# Patient Record
Sex: Male | Born: 1940 | Race: White | Hispanic: No | Smoking: Never smoker
Health system: Southern US, Community
[De-identification: ages and names within clinical notes are randomized; demographics above are authoritative.]

## PROBLEM LIST (undated history)

## (undated) DIAGNOSIS — I10 Essential (primary) hypertension: Secondary | ICD-10-CM

---

## 2006-04-17 ENCOUNTER — Emergency Department: Payer: Self-pay | Admitting: Emergency Medicine

## 2006-04-17 ENCOUNTER — Other Ambulatory Visit: Payer: Self-pay

## 2014-04-06 ENCOUNTER — Emergency Department: Payer: Self-pay | Admitting: Emergency Medicine

## 2015-02-10 ENCOUNTER — Inpatient Hospital Stay: Payer: Self-pay | Admitting: Internal Medicine

## 2015-02-19 ENCOUNTER — Ambulatory Visit: Payer: Self-pay

## 2015-04-15 NOTE — H&P (Signed)
PATIENT NAME:  Luke HolmesMAY, Luke J MR#:  [id number] DATE OF BIRTH:  1941-08-09  DATE OF ADMISSION:  02/10/2015  REFERRING PHYSICIAN: Eartha Inchory R. York CeriseForbach, MD   PRIMARY CARE PHYSICIAN: Nonlocal.   ADMISSION DIAGNOSES: Urinary tract infection and sepsis.   HISTORY OF PRESENT ILLNESS: This is a 74 year old Caucasian male who presents to the Emergency Department via EMS after being found confused on the floor of his bathroom at home. The patient reportedly had oxygen saturations in the 70s prior to transportation to the Emergency Department. Here, at the hospital, his oxygen saturations were found to be normal. He was initially very confused, but now is more clear about his surroundings and the circumstances are preceding his evaluation in the Emergency Department. He remembers feeling ill for 3-4 days. He has had multiple episodes of nausea and vomiting, all of which have been nonbloody and nonbilious. The patient admits to some dysuria lately, but denies chest pain or shortness of breath. The patient admits to fevers. In the Emergency Department, he was found to have a grossly infected urine as well as leukocytosis and fever, which prompted the Emergency Department to call for admission.   REVIEW OF SYSTEMS:  CONSTITUTIONAL: The patient admits to fever and weakness.  EYES: Denies blurred vision or inflammation.  EARS, NOSE AND THROAT: Denies tinnitus or sore throat.  RESPIRATORY: Denies cough or shortness of breath.  CARDIOVASCULAR: Denies chest pain, palpitations, orthopnea, or paroxysmal nocturnal dyspnea.  GASTROINTESTINAL: Admits to nausea and vomiting, but denies diarrhea or abdominal pain.  GENITOURINARY: Admits to dysuria as well as increased frequency, but denies hesitancy of urination.  ENDOCRINE: Denies polyuria or polydipsia.  HEMATOLOGIC AND LYMPHATIC: Denies easy bruising or bleeding.  INTEGUMENTARY: Denies rashes or lesions.  MUSCULOSKELETAL: Denies arthralgias or myalgias.  NEUROLOGIC: Denies  numbness in his extremities or difficulty speaking.  PSYCHIATRIC: Denies depression or suicidal ideation.   PAST MEDICAL HISTORY: Hypertension and anxiety.   PAST SURGICAL HISTORY: The patient has never undergone surgery.   SOCIAL HISTORY: The patient lives with his sister and her boyfriend. He does not smoke, drink, or do any drugs.   FAMILY HISTORY: The patient's father is deceased of coronary artery disease and his mother is deceased of complications from emphysema.   HOME MEDICATIONS: 1.  Aspirin 81 mg 1 tablet p.o. daily.  2.  Clonidine, dose not specified, 1 tablet p.o. daily.  3.  Hydrochlorothiazide, dose unspecified, 1 tablet p.o. daily.  4.  Klor-Con, dose unspecified.  5.  Vicodin, dose unspecified.   ALLERGIES: SULFA DRUGS.   PERTINENT LABORATORY RESULTS AND RADIOGRAPHIC FINDINGS: Serum glucose is 202, BUN is 15, creatinine 1.04, serum sodium is 132, potassium is 3.6, chloride is 96, bicarbonate 30, calcium is 8.3, magnesium is 1.6, phosphorus 1.3. Lipase is 51, serum albumin is 3.3, alkaline phosphatase 77, AST is 32, ALT 25. Troponin is negative. White blood cell count is 15.3, hemoglobin 15.4, hematocrit 45.7, platelet count is 234,000, MCV is 86. Urinalysis shows 368 white blood cells per high-power field, 9 red blood cells per high-power field, it is nitrite positive as well as 2+ leukocyte esterase positive. Lactic acid is 2.2. Chest x-ray shows an ill-defined right basilar opacity, which Mehler reflect pneumonia.   PHYSICAL EXAMINATION:  VITAL SIGNS: Temperature is 102.1, pulse is 88, respirations are 24, blood pressure 126/69, pulse oximetry is 91% on room air.  GENERAL: The patient is alert and oriented x 3 in no apparent distress.  HEENT: Normocephalic, atraumatic. Pupils equal, round, and reactive to  light and accommodation. Extraocular movements are intact. Mucous membranes are moist.  NECK: Trachea is midline. No adenopathy. Thyroid is nonpalpable and nontender.   CHEST: Symmetric and atraumatic.  CARDIOVASCULAR: Regular rate and rhythm. Normal S1, S2. No rubs, clicks, or murmurs appreciated.  LUNGS: Clear to auscultation bilaterally. Normal effort and excursion.  ABDOMEN: Positive bowel sounds. Soft, nontender, nondistended. No hepatosplenomegaly.  GENITOURINARY: Deferred.  MUSCULOSKELETAL: The patient moves all 4 extremities equally. I have not observed his gait.  SKIN: Warm and dry. No rashes or lesions.  EXTREMITIES: No clubbing, cyanosis, or edema.  NEUROLOGIC: Cranial nerves II-XII are grossly intact.  PSYCHIATRIC: Mood is normal. Affect is congruent. The patient has good judgment and insight into his medical condition.   ASSESSMENT AND PLAN: This is a 74 year old male admitted for urinary tract infection and sepsis.  1.  Urinary tract infection. The patient has been given ceftriaxone in the Emergency Room. We have obtained blood cultures and urine cultures. I will add vancomycin to the patient's regimen, as he meets criteria for sepsis and also because he Waiters have pneumonia as well.  2.  Sepsis. The patient meets criteria via fever and leukocytosis. He is hemodynamically stable at this time.  3.  Hypertension. We will continue hydrochlorothiazide. The patient also has clonidine on his home medication list, but the doses of these medicines or not available. I believe his clonidine [place] be for anxiety, but if he appears to have rebound hypertension or blood pressure that is way more than expected, I will restart this medicine as it must be scheduled.  4.  Hyponatremia. This is probably likely secondary to hypovolemia. We will volume resuscitate him at with sodium in normal saline that should improve his serum sodium concentration.  5.  Deep vein thrombosis prophylaxis. Heparin.  6.  Gastrointestinal prophylaxis. None, as the patient is not critically ill.   CODE STATUS: The patient is a full code.   TIME SPENT ON ADMISSION ORDERS AND PATIENT CARE:  Approximately 45 minutes.     ____________________________ Kelton Pillar. Sheryle Hail, MD msd:bm D: 02/10/2015 07:43:11 ET T: 02/10/2015 07:58:19 ET JOB#: [id number]  cc: Kelton Pillar. Sheryle Hail, MD, <Dictator> Kelton Pillar Toshia Larkin MD ELECTRONICALLY SIGNED 02/10/2015 19:40

## 2015-04-15 NOTE — Discharge Summary (Signed)
PATIENT NAME:  Janeal HolmesMAY, Rubel J MR#:  [id number] DATE OF BIRTH:  1941-08-02  DATE OF ADMISSION:  02/10/2015 DATE OF DISCHARGE:  02/12/2015  PRESENTING COMPLAINT: Altered mental status.   DISCHARGE DIAGNOSES:  1.  Sepsis due to urinary tract infection. 2.  Urinary tract infection with pansensitive Escherichia coli.  3.  Altered mental status due to sepsis. 4.  Possible pneumonia.  5.  Hypertension.  6.  Hyponatremia.  7.  Hypokalemia.   CONSULTATIONS: None.   PROCEDURES:  1.  CT scan of the head without contrast shows atrophy and small vessel disease. No evidence for acute intracranial abnormality. Chronic sinusitis.  2.  Chest x-ray shows ill-defined right basilar opacity that Jeanmarie reflect pneumonia in the appropriate clinical setting. Dedicated PA and lateral views Arganbright be helpful if the patient is able.   HISTORY OF PRESENT ILLNESS: This 74 year old Caucasian man presents to the Emergency Room by EMS past after being found confused on the floor of his bathroom at home. He also had oxygen saturation in the 70s prior to transportation by EMS. On presentation his saturation is normal. He is confused. He reports feeling ill for 3-4 days with multiple episodes of nausea and vomiting. He admits to recent dysuria but no shortness of breath or chest pain. He was found to be febrile with leukocytosis and was admitted for sepsis.  1.  Sepsis due to urinary tract infection: Urinalysis frankly positive for infection. Chest x-ray very questionable for pneumonia. No respiratory distress or other respiratory symptoms during admission. Blood cultures were negative. He was treated with Rocephin and vancomycin initially and this was changed to Rocephin only and then to Levaquin on discharge.  2.  Urinary tract infection: Culture shows pansensitive Escherichia coli. The patient has been treated with Rocephin and will continue course of Levaquin for complicated urinary tract infection for 7 days. He notes that he has  had several urinary tract infections in the past. He has never seen a urologist and was referred to a urologist upon discharge for evaluation due to recurrent urinary tract infections.   3.  Hyponatremia. Presenting serum sodium was 132. This improved with IV fluid.  4.  Hypokalemia. The patient became hypokalemic after discharge with a potassium of 3.0. This was repleted. His hydrochlorothiazide was also stopped as this can decrease serum potassium. This should be followed in the outpatient setting.   5.  Hypomagnesium and hypophosphatemia: These are also likely due to gastrointestinal losses with vomiting. These were repleted in the hospital and should be rechecked in the outpatient setting. 6.  Metabolic encephalopathy: Altered mental status on presentation resolved quickly with treatment of the urinary tract infection. At the time of discharge he is intact. Family is present and they report that he is at his baseline. He has also been evaluated by physical therapy. He reports that he has no further home health needs.  7.  Fall: While he was encephalopathic he did have a fall while getting up out of a bad and he did have a head impact. CT scan of the head was negative for any signs of bleeding. He did not have any visible trauma as a result of the fall. Physical therapy has evaluated him and no further home health physical therapy is needed.   DISCHARGE PHYSICAL EXAMINATION:  VITAL SIGNS: Temperature 97.5, pulse 77, respirations 18, blood pressure 168/88, oxygenation 95% on room air.  GENERAL: No acute distress.  RESPIRATORY: Lungs clear to auscultation bilaterally with good air movement.  CARDIOVASCULAR:  Regular rate and rhythm. No murmurs, rubs, or gallops.  ABDOMEN: Soft, nontender, nondistended. No guarding, no rebound. Bowel sounds are normal.  PSYCHIATRIC: The patient is alert and oriented with good insight into his clinical conditions.   LABORATORY DATA: Sodium 141, potassium 3.2 (This has  been repleted with 80 milliequivalents of potassium over the course of 8 hours today). Chloride 104, bicarbonate 30, BUN 6, creatinine 0.68, glucose 126, magnesium 1.9. LFTs normal. White blood cells 7.9, hemoglobin 13.1, platelets 236,000, MCV 85. Urine is positive for greater than 100,000 colonies of pansensitive Escherichia coli. Blood cultures negative x2.   DISCHARGE MEDICATIONS:  1.  Alprazolam 1 mg twice a day as needed for anxiety.  2.  Enalapril 20 mg 1 tablet twice a day.  3.  Amlodipine 5 mg 1 tablet once a day.  4.  Vicodin extended release 300 mg/7.5 mg 1 tablet every 8 hours as needed for pain.  5.  Aspirin 81 mg 1 tablet once a day.  6.  Levofloxacin 750 mg 1 tablet once a day for 5 days to complete a 7 day course.   DISPOSITION: Discharged to home with no further home health needs.   CONDITION ON DISCHARGE: Stable.   DISCHARGE INSTRUCTIONS:  DIET: Heart healthy diet.  ACTIVITY RESTRICTIONS: None.  TIME FRAME FOR FOLLOW-UP:  Please follow up as scheduled with Dr. Vanna Scotland and with Dr. Arlana Pouch.   TIME SPENT ON DISCHARGE: 40 minutes.   ____________________________ Ena Dawley. Clent Ridges, MD cpw:mc D: 02/12/2015 21:06:59 ET T: 02/13/2015 09:02:26 ET JOB#: [id number]  cc: Ena Dawley. Clent Ridges, MD, <Dictator> Claris Gladden, MD Jillene Bucks. Arlana Pouch, MD  Gale Journey MD ELECTRONICALLY SIGNED 02/24/2015 10:50

## 2016-02-21 ENCOUNTER — Telehealth: Payer: Self-pay

## 2016-02-21 NOTE — Telephone Encounter (Signed)
Spoke with pt daughter in law and made aware pt needs an appt prior to a refill on medications. Daughter in law stated that pt is having urinary symptoms and needs to be seen. Daughter in law stated she would have pt return the call when he returns home.

## 2016-03-24 ENCOUNTER — Other Ambulatory Visit: Payer: Self-pay | Admitting: Family Medicine

## 2016-03-24 DIAGNOSIS — N5089 Other specified disorders of the male genital organs: Secondary | ICD-10-CM

## 2016-03-25 ENCOUNTER — Ambulatory Visit: Payer: Medicare HMO

## 2016-03-28 ENCOUNTER — Ambulatory Visit
Admission: RE | Admit: 2016-03-28 | Discharge: 2016-03-28 | Disposition: A | Discharge status: Home / Self Care | Payer: Medicare HMO | Source: Ambulatory Visit | Attending: Family Medicine | Admitting: Family Medicine

## 2016-03-28 DIAGNOSIS — N451 Epididymitis: Secondary | ICD-10-CM | POA: Diagnosis not present

## 2016-03-28 DIAGNOSIS — N5089 Other specified disorders of the male genital organs: Secondary | ICD-10-CM

## 2016-03-28 DIAGNOSIS — N509 Disorder of male genital organs, unspecified: Secondary | ICD-10-CM | POA: Diagnosis present

## 2016-03-28 DIAGNOSIS — N433 Hydrocele, unspecified: Secondary | ICD-10-CM | POA: Insufficient documentation

## 2019-11-09 ENCOUNTER — Emergency Department
Admission: EM | Admit: 2019-11-09 | Discharge: 2019-11-10 | Disposition: A | Discharge status: Home / Self Care | Payer: Medicare HMO | Attending: Student | Admitting: Student

## 2019-11-09 ENCOUNTER — Other Ambulatory Visit: Payer: Self-pay

## 2019-11-09 ENCOUNTER — Encounter: Payer: Self-pay | Admitting: Emergency Medicine

## 2019-11-09 ENCOUNTER — Emergency Department: Payer: Medicare HMO

## 2019-11-09 DIAGNOSIS — R2243 Localized swelling, mass and lump, lower limb, bilateral: Secondary | ICD-10-CM | POA: Insufficient documentation

## 2019-11-09 DIAGNOSIS — R42 Dizziness and giddiness: Secondary | ICD-10-CM | POA: Diagnosis not present

## 2019-11-09 DIAGNOSIS — M7989 Other specified soft tissue disorders: Secondary | ICD-10-CM

## 2019-11-09 DIAGNOSIS — I10 Essential (primary) hypertension: Secondary | ICD-10-CM | POA: Insufficient documentation

## 2019-11-09 DIAGNOSIS — L03116 Cellulitis of left lower limb: Secondary | ICD-10-CM | POA: Insufficient documentation

## 2019-11-09 DIAGNOSIS — L539 Erythematous condition, unspecified: Secondary | ICD-10-CM | POA: Diagnosis not present

## 2019-11-09 HISTORY — DX: Essential (primary) hypertension: I10

## 2019-11-09 LAB — CBC
HCT: 42.8 % (ref 39.0–52.0)
Hemoglobin: 14.7 g/dL (ref 13.0–17.0)
MCH: 29.8 pg (ref 26.0–34.0)
MCHC: 34.3 g/dL (ref 30.0–36.0)
MCV: 86.6 fL (ref 80.0–100.0)
Platelets: 311 10*3/uL (ref 150–400)
RBC: 4.94 MIL/uL (ref 4.22–5.81)
RDW: 11.9 % (ref 11.5–15.5)
WBC: 15.2 10*3/uL — ABNORMAL HIGH (ref 4.0–10.5)
nRBC: 0 % (ref 0.0–0.2)

## 2019-11-09 LAB — BASIC METABOLIC PANEL
Anion gap: 10 (ref 5–15)
BUN: 15 mg/dL (ref 8–23)
CO2: 25 mmol/L (ref 22–32)
Calcium: 9.5 mg/dL (ref 8.9–10.3)
Chloride: 99 mmol/L (ref 98–111)
Creatinine, Ser: 0.62 mg/dL (ref 0.61–1.24)
GFR calc Af Amer: >60 mL/min (ref >60)
GFR calc non Af Amer: >60 mL/min (ref >60)
Glucose, Bld: 141 mg/dL — ABNORMAL HIGH (ref 70–99)
Potassium: 4 mmol/L (ref 3.5–5.1)
Sodium: 134 mmol/L — ABNORMAL LOW (ref 135–145)

## 2019-11-09 LAB — TROPONIN I (HIGH SENSITIVITY): Troponin I (High Sensitivity): 5 ng/L (ref <18)

## 2019-11-09 MED ORDER — SODIUM CHLORIDE 0.9% FLUSH: 3.0000 mL | Freq: Once | INTRAVENOUS | Status: DC

## 2019-11-09 NOTE — ED Triage Notes (Signed)
PT c/o dizziness, states hx of vertigo but worsening x1wk with a fall last week. Denies any numbness or weakness. Pt appears to have slurred speech, pt does not have dentures in place. PT states over the past few weeks he has been told his speech sounds off by family. Denies taking any anticoags

## 2019-11-09 NOTE — ED Triage Notes (Signed)
ED via EMS for intermittent dizziness with hx of vertigo dx 1 week ago. Pt had a fall 4 days ago due to increased bilateral leg swelling. Pt reports the swelling is worsening and he feels as though he is having difficulty ambulating independently.   167 CBG 18G Rhand

## 2019-11-09 NOTE — ED Provider Notes (Signed)
[facility] Emergency Department Provider Note  ____________________________________________   None    (approximate)  I have reviewed the triage vital signs and the nursing notes.  History  Chief Complaint Dizziness    HPI Luke Haynes is a 78 y.o. male with history of HTN who presents to the emergency department for bilateral leg swelling.  Patient states he has noticed progressively increasing bilateral lower extremity swelling over the last several days, left slightly greater than right.  He is unsure if the swelling is related, but states he first noticed it after a mild mechanical fall several days ago.  He states he fell because he was "clumsy" and denies any preceding presyncopal symptoms, specifically denies dizziness, lightheadedness, LOC, chest pain, visual changes, or vertigo.  He states he fell softly to his side, and "popped right back up" with no related injuries.  He states one of his family members was concerned he might have a blood clot as the etiology of his leg swelling, and advised him to seek care.  He denies any history of VTE, no recent surgeries or prolonged immobilization, no recent travel.  He denies any cough, shortness of breath, difficulty breathing, or chest pain. He does not some mild redness to the left anterior shin. Denies any trauma to this area.  Per triage note, patient had presented with EMS for intermittent dizziness in the setting of a history of vertigo.  However, on my interview the patient specifically denies any recent dizziness, lightheadedness, or recent episodes of vertigo.  He specifically denies any vertigo as etiology for his recent fall.  He denies any changes to his speech or slurred speech.  He denies any extremity weakness, numbness, tingling. He thinks maybe his family miscommunication or was misunderstood, and were trying to simply tell EMS that he has a history of vertigo, with regards to his general medical  historyo.   Past Medical Hx Past Medical History:  Diagnosis Date  . Hypertension     Problem List There are no active problems to display for this patient.   Past Surgical Hx History reviewed. No pertinent surgical history.  Medications Prior to Admission medications   Not on File    Allergies Patient has no known allergies.  Family Hx No family history on file.  Social Hx Social History   Tobacco Use  . Smoking status: Never Smoker  . Smokeless tobacco: Never Used  Substance Use Topics  . Alcohol use: Not Currently  . Drug use: Never     Review of Systems  Constitutional: Negative for fever, chills. Eyes: Negative for visual changes. ENT: Negative for sore throat. Cardiovascular: Negative for chest pain. Respiratory: Negative for shortness of breath. Gastrointestinal: Negative for nausea, vomiting.  Genitourinary: Negative for dysuria. Musculoskeletal: + for leg swelling. Skin: Negative for rash. Neurological: Negative for for headaches.   Physical Exam  Vital Signs: ED Triage Vitals  Enc Vitals Group     BP 11/09/19 1841 (!) 141/88     Pulse Rate 11/09/19 1841 78     Resp 11/09/19 1841 18     Temp 11/09/19 1841 97.7 F (36.5 C)     Temp Source 11/09/19 1841 Oral     SpO2 11/09/19 1841 98 %     Weight 11/09/19 1842 200 lb (90.7 kg)     Height 11/09/19 1842 5\' 9"  (1.753 m)     Head Circumference --      Peak Flow --  Pain Score 11/09/19 1853 0     Pain Loc --      Pain Edu? --      Excl. in GC? --     Constitutional: Alert and oriented.  Head: Normocephalic. Atraumatic. Eyes: Conjunctivae clear. Sclera anicteric. Nose: No congestion. No rhinorrhea. Mouth/Throat: Wearing mask.  Neck: No stridor.   Cardiovascular: Normal rate, regular rhythm. Extremities well perfused. Respiratory: Normal respiratory effort.  Lungs CTAB. Gastrointestinal: Soft. Non-tender. Non-distended.  Musculoskeletal: Bilateral pitting edema to the LE to level  of the knee. LEFT very slightly larger than R. Left anterior shin with mild associated erythema and warmth.  Neurologic:  No gross focal neurologic deficits are appreciated. Alert and oriented.  Face symmetric.  Tongue midline.  Cranial nerves II through XII intact. UE and LE strength 5/5 and symmetric. UE and LE SILT. No dysarthria or aphasia.  Skin: Mild erythema and warmth to the left anterior shin. Psychiatric: Mood and affect are appropriate for situation.  EKG  Personally reviewed.   Rate: 77 Rhythm: sinus Axis: normal Intervals: WNL PVCs No STEMI    Radiology  CTH:  IMPRESSION:  No evidence of acute intracranial abnormality.  Atrophy with small vessel ischemic changes.   [place]:  IMPRESSION: No evidence of deep venous thrombosis in either lower extremity.  Right popliteal cyst.   CXR:  IMPRESSION:  1. Mild central venous congestion without frank pulmonary edema.  2. Unchanged mild cardiomegaly.    Procedures  Procedure(s) performed (including critical care):  Procedures   Initial Impression / Assessment and Plan / ED Course  78 y.o. male who presents to the ED for bilateral lower extremity swelling, LEFT slightly greater than RIGHT.  With some mild LEFT anterior shin redness, warmth.  Ddx: cellulitis, DVT, venous insufficiency  Labs revealed mild leukocytosis to 15.  CT head negative. [place] negative for DVT, right popliteal cyst noted. As such, given otherwise negative work-up, will plan for course of antibiotic for treatment of cellulitis, based on exam. Advise PCP follow up, given return precautions.  Patient voices understanding is comfortable plan and discharge.  Given first dose of antibiotics here.    Final Clinical Impression(s) / ED Diagnosis  Final diagnoses:  Leg swelling  Cellulitis of left lower extremity       Note:  This document was prepared using Dragon voice recognition software and Carollo include unintentional dictation errors.   Miguel Aschoff., MD 11/10/19 [phone]

## 2019-11-10 ENCOUNTER — Emergency Department: Payer: Medicare HMO

## 2019-11-10 LAB — URINALYSIS, COMPLETE (UACMP) WITH MICROSCOPIC
Bacteria, UA: NONE SEEN
Bilirubin Urine: NEGATIVE
Glucose, UA: NEGATIVE mg/dL
Hgb urine dipstick: NEGATIVE
Ketones, ur: NEGATIVE mg/dL
Leukocytes,Ua: NEGATIVE
Nitrite: NEGATIVE
Protein, ur: NEGATIVE mg/dL
Specific Gravity, Urine: 1.008 (ref 1.005–1.030)
Squamous Epithelial / LPF: NONE SEEN (ref 0–5)
WBC, UA: NONE SEEN WBC/hpf (ref 0–5)
pH: 7 (ref 5.0–8.0)

## 2019-11-10 LAB — BRAIN NATRIURETIC PEPTIDE: B Natriuretic Peptide: 113 pg/mL — ABNORMAL HIGH (ref 0.0–100.0)

## 2019-11-10 MED ORDER — CEPHALEXIN 500 MG PO CAPS: 500.0000 mg | ORAL_CAPSULE | Freq: Four times a day (QID) | ORAL | 0 refills | Status: AC

## 2019-11-10 MED ORDER — CEPHALEXIN 500 MG PO CAPS
500.0000 mg | ORAL_CAPSULE | Freq: Once | ORAL | Status: AC
  Administered 2019-11-10: 500 mg via ORAL
  Filled 2019-11-10: qty 1

## 2019-11-10 NOTE — Discharge Instructions (Signed)
Thank you for letting us take care of you in the emergency department today. Your ultrasound was negative for blood clots.   Please continue to take any regular, prescribed medications.   New medications we have prescribed:  - Keflex - for skin infection  Please follow up with: - Your primary care doctor to review your ER visit and follow up on your symptoms.   Please return to the ER for any new or worsening symptoms.

## 2019-12-07 ENCOUNTER — Emergency Department: Payer: Medicare HMO

## 2019-12-07 ENCOUNTER — Observation Stay: Payer: Medicare HMO

## 2019-12-07 ENCOUNTER — Other Ambulatory Visit: Payer: Self-pay

## 2019-12-07 ENCOUNTER — Inpatient Hospital Stay
Admission: EM | Admit: 2019-12-07 | Discharge: 2019-12-11 | DRG: 872 | Disposition: A | Discharge status: Skilled Nursing Facility | Payer: Medicare HMO | Attending: Internal Medicine | Admitting: Internal Medicine

## 2019-12-07 DIAGNOSIS — I119 Hypertensive heart disease without heart failure: Secondary | ICD-10-CM | POA: Diagnosis present

## 2019-12-07 DIAGNOSIS — R5381 Other malaise: Secondary | ICD-10-CM | POA: Diagnosis not present

## 2019-12-07 DIAGNOSIS — A4151 Sepsis due to Escherichia coli [E. coli]: Principal | ICD-10-CM | POA: Diagnosis present

## 2019-12-07 DIAGNOSIS — N3090 Cystitis, unspecified without hematuria: Secondary | ICD-10-CM | POA: Diagnosis present

## 2019-12-07 DIAGNOSIS — E860 Dehydration: Secondary | ICD-10-CM | POA: Diagnosis present

## 2019-12-07 DIAGNOSIS — R6520 Severe sepsis without septic shock: Secondary | ICD-10-CM | POA: Diagnosis present

## 2019-12-07 DIAGNOSIS — R627 Adult failure to thrive: Secondary | ICD-10-CM | POA: Diagnosis present

## 2019-12-07 DIAGNOSIS — R7989 Other specified abnormal findings of blood chemistry: Secondary | ICD-10-CM | POA: Diagnosis present

## 2019-12-07 DIAGNOSIS — A419 Sepsis, unspecified organism: Secondary | ICD-10-CM | POA: Diagnosis present

## 2019-12-07 DIAGNOSIS — N179 Acute kidney failure, unspecified: Secondary | ICD-10-CM | POA: Diagnosis not present

## 2019-12-07 DIAGNOSIS — N390 Urinary tract infection, site not specified: Secondary | ICD-10-CM | POA: Diagnosis not present

## 2019-12-07 DIAGNOSIS — M6282 Rhabdomyolysis: Secondary | ICD-10-CM | POA: Diagnosis present

## 2019-12-07 DIAGNOSIS — I248 Other forms of acute ischemic heart disease: Secondary | ICD-10-CM | POA: Diagnosis present

## 2019-12-07 DIAGNOSIS — R778 Other specified abnormalities of plasma proteins: Secondary | ICD-10-CM

## 2019-12-07 DIAGNOSIS — E871 Hypo-osmolality and hyponatremia: Secondary | ICD-10-CM | POA: Diagnosis present

## 2019-12-07 DIAGNOSIS — Z882 Allergy status to sulfonamides status: Secondary | ICD-10-CM

## 2019-12-07 DIAGNOSIS — R296 Repeated falls: Secondary | ICD-10-CM | POA: Diagnosis present

## 2019-12-07 DIAGNOSIS — Z79899 Other long term (current) drug therapy: Secondary | ICD-10-CM

## 2019-12-07 DIAGNOSIS — I10 Essential (primary) hypertension: Secondary | ICD-10-CM | POA: Diagnosis present

## 2019-12-07 DIAGNOSIS — N400 Enlarged prostate without lower urinary tract symptoms: Secondary | ICD-10-CM | POA: Diagnosis present

## 2019-12-07 DIAGNOSIS — E785 Hyperlipidemia, unspecified: Secondary | ICD-10-CM | POA: Diagnosis present

## 2019-12-07 DIAGNOSIS — Z87440 Personal history of urinary (tract) infections: Secondary | ICD-10-CM

## 2019-12-07 DIAGNOSIS — Z20828 Contact with and (suspected) exposure to other viral communicable diseases: Secondary | ICD-10-CM | POA: Diagnosis present

## 2019-12-07 LAB — SODIUM, URINE, RANDOM: Sodium, Ur: <10 mmol/L

## 2019-12-07 LAB — URINALYSIS, COMPLETE (UACMP) WITH MICROSCOPIC
Bilirubin Urine: NEGATIVE
Glucose, UA: NEGATIVE mg/dL
Ketones, ur: NEGATIVE mg/dL
Nitrite: NEGATIVE
Protein, ur: 100 mg/dL — AB
Specific Gravity, Urine: 1.017 (ref 1.005–1.030)
Squamous Epithelial / HPF: NONE SEEN (ref 0–5)
WBC, UA: >50 WBC/hpf — ABNORMAL HIGH (ref 0–5)
pH: 5 (ref 5.0–8.0)

## 2019-12-07 LAB — CBC WITH DIFFERENTIAL/PLATELET
Abs Immature Granulocytes: 0.25 10*3/uL — ABNORMAL HIGH (ref 0.00–0.07)
Basophils Absolute: 0.1 10*3/uL (ref 0.0–0.1)
Basophils Relative: 0 %
Eosinophils Absolute: 0.1 10*3/uL (ref 0.0–0.5)
Eosinophils Relative: 0 %
HCT: 35.3 % — ABNORMAL LOW (ref 39.0–52.0)
Hemoglobin: 12.4 g/dL — ABNORMAL LOW (ref 13.0–17.0)
Immature Granulocytes: 1 %
Lymphocytes Relative: 3 %
Lymphs Abs: 0.7 10*3/uL (ref 0.7–4.0)
MCH: 29.2 pg (ref 26.0–34.0)
MCHC: 35.1 g/dL (ref 30.0–36.0)
MCV: 83.1 fL (ref 80.0–100.0)
Monocytes Absolute: 1.4 10*3/uL — ABNORMAL HIGH (ref 0.1–1.0)
Monocytes Relative: 6 %
Neutro Abs: 19.1 10*3/uL — ABNORMAL HIGH (ref 1.7–7.7)
Neutrophils Relative %: 90 %
Platelets: 284 10*3/uL (ref 150–400)
RBC: 4.25 MIL/uL (ref 4.22–5.81)
RDW: 12.3 % (ref 11.5–15.5)
WBC: 21.5 10*3/uL — ABNORMAL HIGH (ref 4.0–10.5)
nRBC: 0 % (ref 0.0–0.2)

## 2019-12-07 LAB — LIPASE, BLOOD: Lipase: 16 U/L (ref 11–51)

## 2019-12-07 LAB — COMPREHENSIVE METABOLIC PANEL
ALT: 38 U/L (ref 0–44)
AST: 80 U/L — ABNORMAL HIGH (ref 15–41)
Albumin: 2.9 g/dL — ABNORMAL LOW (ref 3.5–5.0)
Alkaline Phosphatase: 67 U/L (ref 38–126)
Anion gap: 14 (ref 5–15)
BUN: 45 mg/dL — ABNORMAL HIGH (ref 8–23)
CO2: 23 mmol/L (ref 22–32)
Calcium: 8.6 mg/dL — ABNORMAL LOW (ref 8.9–10.3)
Chloride: 91 mmol/L — ABNORMAL LOW (ref 98–111)
Creatinine, Ser: 1.62 mg/dL — ABNORMAL HIGH (ref 0.61–1.24)
GFR calc Af Amer: 46 mL/min — ABNORMAL LOW (ref >60)
GFR calc non Af Amer: 40 mL/min — ABNORMAL LOW (ref >60)
Glucose, Bld: 136 mg/dL — ABNORMAL HIGH (ref 70–99)
Potassium: 3.7 mmol/L (ref 3.5–5.1)
Sodium: 128 mmol/L — ABNORMAL LOW (ref 135–145)
Total Bilirubin: 1.5 mg/dL — ABNORMAL HIGH (ref 0.3–1.2)
Total Protein: 6.8 g/dL (ref 6.5–8.1)

## 2019-12-07 LAB — OSMOLALITY: Osmolality: 290 mOsm/kg (ref 275–295)

## 2019-12-07 LAB — PHOSPHORUS: Phosphorus: 2.2 mg/dL — ABNORMAL LOW (ref 2.5–4.6)

## 2019-12-07 LAB — TROPONIN I (HIGH SENSITIVITY)
Troponin I (High Sensitivity): 45 ng/L — ABNORMAL HIGH (ref <18)
Troponin I (High Sensitivity): 55 ng/L — ABNORMAL HIGH (ref <18)

## 2019-12-07 LAB — OSMOLALITY, URINE: Osmolality, Ur: 553 mOsm/kg (ref 300–900)

## 2019-12-07 LAB — SARS CORONAVIRUS 2 (TAT 6-24 HRS): SARS Coronavirus 2: NEGATIVE

## 2019-12-07 LAB — CK: Total CK: 944 U/L — ABNORMAL HIGH (ref 49–397)

## 2019-12-07 LAB — CREATININE, URINE, RANDOM: Creatinine, Urine: 135 mg/dL

## 2019-12-07 LAB — LACTIC ACID, PLASMA: Lactic Acid, Venous: 1.5 mmol/L (ref 0.5–1.9)

## 2019-12-07 LAB — POC SARS CORONAVIRUS 2 AG: SARS Coronavirus 2 Ag: NEGATIVE

## 2019-12-07 LAB — BRAIN NATRIURETIC PEPTIDE: B Natriuretic Peptide: 346 pg/mL — ABNORMAL HIGH (ref 0.0–100.0)

## 2019-12-07 LAB — MAGNESIUM: Magnesium: 2.2 mg/dL (ref 1.7–2.4)

## 2019-12-07 LAB — PROCALCITONIN: Procalcitonin: 4.25 ng/mL

## 2019-12-07 MED ORDER — ACETAMINOPHEN 650 MG RE SUPP: 650.0000 mg | Freq: Four times a day (QID) | RECTAL | Status: DC | PRN

## 2019-12-07 MED ORDER — TAMSULOSIN HCL 0.4 MG PO CAPS
0.4000 mg | ORAL_CAPSULE | Freq: Every day | ORAL | Status: DC
  Administered 2019-12-08 – 2019-12-11 (×5): 0.4 mg via ORAL
  Filled 2019-12-07 (×5): qty 1

## 2019-12-07 MED ORDER — K PHOS MONO-SOD PHOS DI & MONO 155-852-130 MG PO TABS
250.0000 mg | ORAL_TABLET | Freq: Every day | ORAL | Status: DC
  Filled 2019-12-07 (×2): qty 1

## 2019-12-07 MED ORDER — NEBIVOLOL HCL 5 MG PO TABS
5.0000 mg | ORAL_TABLET | Freq: Every day | ORAL | Status: DC
  Administered 2019-12-08 – 2019-12-10 (×3): 5 mg via ORAL
  Filled 2019-12-07 (×5): qty 1

## 2019-12-07 MED ORDER — ALPRAZOLAM 0.5 MG PO TABS
0.5000 mg | ORAL_TABLET | Freq: Three times a day (TID) | ORAL | Status: DC
  Administered 2019-12-08 – 2019-12-09 (×6): 0.5 mg via ORAL
  Filled 2019-12-07 (×7): qty 1

## 2019-12-07 MED ORDER — SODIUM CHLORIDE 0.9 % IV SOLN: INTRAVENOUS | Status: AC

## 2019-12-07 MED ORDER — ACETAMINOPHEN 325 MG PO TABS
650.0000 mg | ORAL_TABLET | Freq: Four times a day (QID) | ORAL | Status: DC | PRN
  Administered 2019-12-08 – 2019-12-09 (×4): 650 mg via ORAL
  Filled 2019-12-07 (×4): qty 2

## 2019-12-07 MED ORDER — SODIUM CHLORIDE 0.9 % IV BOLUS
1000.0000 mL | Freq: Once | INTRAVENOUS | Status: AC
  Administered 2019-12-07: 1000 mL via INTRAVENOUS

## 2019-12-07 MED ORDER — ENOXAPARIN SODIUM 40 MG/0.4ML ~~LOC~~ SOLN
40.0000 mg | SUBCUTANEOUS | Status: DC
  Administered 2019-12-08 – 2019-12-10 (×4): 40 mg via SUBCUTANEOUS
  Filled 2019-12-07 (×4): qty 0.4

## 2019-12-07 MED ORDER — SODIUM CHLORIDE 0.9 % IV SOLN
1.0000 g | INTRAVENOUS | Status: DC
  Administered 2019-12-07: 1 g via INTRAVENOUS
  Filled 2019-12-07 (×2): qty 10

## 2019-12-07 MED ORDER — ONDANSETRON HCL 4 MG PO TABS: 4.0000 mg | ORAL_TABLET | Freq: Four times a day (QID) | ORAL | Status: DC | PRN

## 2019-12-07 MED ORDER — HYDROCODONE-ACETAMINOPHEN 5-325 MG PO TABS: 1.0000 | ORAL_TABLET | ORAL | Status: DC | PRN

## 2019-12-07 MED ORDER — ONDANSETRON HCL 4 MG/2ML IJ SOLN: 4.0000 mg | Freq: Four times a day (QID) | INTRAMUSCULAR | Status: DC | PRN

## 2019-12-07 NOTE — ED Notes (Signed)
Bladder scan = 308

## 2019-12-07 NOTE — ED Notes (Signed)
This rn attempted x1 for straight stick for blood. Will attempt again after xray.

## 2019-12-07 NOTE — ED Notes (Signed)
Transport requested for pt  

## 2019-12-07 NOTE — ED Triage Notes (Signed)
Pt comes from home by EMS after a call for failure to thrive. Daughter in law who he lives with called. Pt's PCP wanted pt transported for possible uti/sepsis. Pt does not meet sepsis with EMS. AOx4. Pt has been laying in the bed for 2 -3 days using the bathroom on himself. 18G right hand. Pt has been falling with ambulation.

## 2019-12-07 NOTE — ED Provider Notes (Signed)
[facility] Emergency Department Provider Note  ____________________________________________  Time seen: Approximately 4:16 PM  I have reviewed the triage vital signs and the nursing notes.   HISTORY  Chief Complaint Failure To Thrive    HPI Luke Haynes is a 78 y.o. male with a past medical history of hypertension and UTIs who comes the ED complaining of generalized weakness, malaise fatigue, decreased oral intake for the past 3 days.  Symptoms are constant, gradual onset, no aggravating or alleviating factors.  He feels lightheaded and weak when he stands up and has fallen multiple times in the last  3 days.  Unable to get out of bed, has been urinating on himself in bed.  Patient denies fever.     Past Medical History:  Diagnosis Date  . Hypertension      There are no problems to display for this patient.    History reviewed. No pertinent surgical history.   Prior to Admission medications   Not on File     Allergies Sulfa antibiotics   History reviewed. No pertinent family history.  Social History Social History   Tobacco Use  . Smoking status: Never Smoker  . Smokeless tobacco: Never Used  Substance Use Topics  . Alcohol use: Not Currently  . Drug use: Never    Review of Systems  Constitutional:   No fever or chills.  ENT:   No sore throat. No rhinorrhea. Cardiovascular:   No chest pain or syncope. Respiratory:   No dyspnea or cough. Gastrointestinal:   Negative for abdominal pain, vomiting and diarrhea.  Musculoskeletal:   Positive left lateral chest wall pain All other systems reviewed and are negative except as documented above in ROS and HPI.  ____________________________________________   PHYSICAL EXAM:  VITAL SIGNS: ED Triage Vitals  Enc Vitals Group     BP 12/07/19 1557 (!) 166/78     Pulse Rate 12/07/19 1557 (!) 103     Resp 12/07/19 1557 18     Temp 12/07/19 1557 99.8 F (37.7 C)     Temp Source  12/07/19 1557 Oral     SpO2 12/07/19 1557 96 %     Weight 12/07/19 1552 198 lb 6.6 oz (90 kg)     Height 12/07/19 1552 5\' 9"  (1.753 m)     Head Circumference --      Peak Flow --      Pain Score 12/07/19 1551 0     Pain Loc --      Pain Edu? --      Excl. in [place]? --     Vital signs reviewed, nursing assessments reviewed.   Constitutional:   Alert and oriented. Non-toxic appearance. Eyes:   Conjunctivae are normal. EOMI. PERRL. ENT      Head:   Normocephalic and atraumatic.      Nose: Normal.      Mouth/Throat:   Dry mucous membranes      Neck:   No meningismus. Full ROM. Hematological/Lymphatic/Immunilogical:   No cervical lymphadenopathy. Cardiovascular:   Tachycardia heart rate 105. Symmetric bilateral radial and DP pulses.  No murmurs. Cap refill less than 2 seconds. Respiratory:   Normal respiratory effort without tachypnea/retractions. Breath sounds are clear and equal bilaterally. No wheezes/rales/rhonchi. Gastrointestinal:   Soft and nontender. Non distended. There is no CVA tenderness.  No rebound, rigidity, or guarding.  Musculoskeletal:   Normal range of motion in all extremities. No joint effusions.  No lower extremity tenderness.  No edema. Neurologic:  Normal speech and language.  Motor grossly intact. No acute focal neurologic deficits are appreciated.  Skin:    Skin is warm, dry and intact. No rash noted.  No petechiae, purpura, or bullae.  ____________________________________________    LABS (pertinent positives/negatives) (all labs ordered are listed, but only abnormal results are displayed) Labs Reviewed  COMPREHENSIVE METABOLIC PANEL - Abnormal; Notable for the following components:      Result Value   Sodium 128 (*)    Chloride 91 (*)    Glucose, Bld 136 (*)    BUN 45 (*)    Creatinine, Ser 1.62 (*)    Calcium 8.6 (*)    Albumin 2.9 (*)    AST 80 (*)    Total Bilirubin 1.5 (*)    GFR calc non Af Amer 40 (*)    GFR calc Af Amer 46 (*)    All  other components within normal limits  CBC WITH DIFFERENTIAL/PLATELET - Abnormal; Notable for the following components:   WBC 21.5 (*)    Hemoglobin 12.4 (*)    HCT 35.3 (*)    Neutro Abs 19.1 (*)    Monocytes Absolute 1.4 (*)    Abs Immature Granulocytes 0.25 (*)    All other components within normal limits  URINALYSIS, COMPLETE (UACMP) WITH MICROSCOPIC - Abnormal; Notable for the following components:   Color, Urine AMBER (*)    APPearance CLOUDY (*)    Hgb urine dipstick LARGE (*)    Protein, ur 100 (*)    Leukocytes,Ua MODERATE (*)    WBC, UA >50 (*)    Bacteria, UA MANY (*)    All other components within normal limits  TROPONIN I (HIGH SENSITIVITY) - Abnormal; Notable for the following components:   Troponin I (High Sensitivity) 45 (*)    All other components within normal limits  CULTURE, BLOOD (SINGLE)  URINE CULTURE  CULTURE, BLOOD (SINGLE)  SARS CORONAVIRUS 2 (TAT 6-24 HRS)  LACTIC ACID, PLASMA  LIPASE, BLOOD  PROCALCITONIN  BRAIN NATRIURETIC PEPTIDE  POC SARS CORONAVIRUS 2 AG -  ED  POC SARS CORONAVIRUS 2 AG   ____________________________________________   EKG  Interpreted by me Sinus tachycardia rate 105.  Normal axis and intervals.  Normal QRS ST segments and T waves.  2 premature supraventricular beats and 1 PVC on the strip.  Repeat EKG shows sinus rhythm rate of 96, normal axis intervals QRS ST segments and T waves.  No ectopy.  ____________________________________________    RADIOLOGY  DG Chest 1 View  Result Date: 12/07/2019 CLINICAL DATA:  78 year old male with weakness. EXAM: CHEST  1 VIEW COMPARISON:  Chest radiograph dated 11/10/2019 FINDINGS: The lungs are clear. There is no pleural effusion pneumothorax. The cardiac silhouette is within normal limits. Atherosclerotic calcification of the aortic arch. No acute osseous pathology. IMPRESSION: No active cardiopulmonary disease. Electronically Signed   By: Elgie Collard M.D.   On: 12/07/2019  16:30    ____________________________________________   PROCEDURES .Critical Care Performed by: Sharman Cheek, MD Authorized by: Sharman Cheek, MD   Critical care provider statement:    Critical care time (minutes):  35   Critical care time was exclusive of:  Separately billable procedures and treating other patients   Critical care was necessary to treat or prevent imminent or life-threatening deterioration of the following conditions:  Sepsis   Critical care was time spent personally by me on the following activities:  Development of treatment plan with patient or surrogate, discussions with consultants, evaluation of  patient's response to treatment, examination of patient, obtaining history from patient or surrogate, ordering and performing treatments and interventions, ordering and review of laboratory studies, ordering and review of radiographic studies, pulse oximetry, re-evaluation of patient's condition and review of old charts    ____________________________________________  DIFFERENTIAL DIAGNOSIS   Dehydration, electrolyte abnormality, UTI, pneumonia, COVID-19, non-STEMI, intracranial hemorrhage, stroke  CLINICAL IMPRESSION / ASSESSMENT AND PLAN / ED COURSE  Medications ordered in the ED: Medications  cefTRIAXone (ROCEPHIN) 1 g in sodium chloride 0.9 % 100 mL IVPB (1 g Intravenous New Bag/Given 12/07/19 1811)  sodium chloride 0.9 % bolus 1,000 mL (0 mLs Intravenous Stopped 12/07/19 1802)  sodium chloride 0.9 % bolus 1,000 mL (1,000 mLs Intravenous New Bag/Given 12/07/19 1805)    Pertinent labs & imaging results that were available during my care of the patient were reviewed by me and considered in my medical decision making (see chart for details).  Luke Haynes was evaluated in Emergency Department on 12/07/2019 for the symptoms described in the history of present illness. He was evaluated in the context of the global COVID-19 pandemic, which necessitated  consideration that the patient might be at risk for infection with the SARS-CoV-2 virus that causes COVID-19. Institutional protocols and algorithms that pertain to the evaluation of patients at risk for COVID-19 are in a state of rapid change based on information released by regulatory bodies including the CDC and federal and state organizations. These policies and algorithms were followed during the patient's care in the ED.   Patient presents with weakness malaise and multiple falls.  Most likely due to dehydration, but concern for infectious process or traumatic injury.  Patient is not septic on arrival, will screen with a single blood culture and lactic acid while checking other labs, chest x-ray, Covid test.   ----------------------------------------- 5:32 PM on 12/07/2019 -----------------------------------------  Initial labs reveal a pronounced leukocytosis of 21,000, AKI associated with hyponatremia of 128.  Reviewing historical vital sign data shows that his temperature of 99.8 today is more than 2 F higher than his previous temperature of 97.7.  In this 78 year old man, I think this qualifies as fever.  Coupled with his tachycardia leukocytosis and suspected recurrent UTI, he meets sepsis criteria at this time.  Will give ceftriaxone.  He has signs of severe sepsis, but no shock.  Lactate is normal.  Clinical Course as of Dec 07 1843  Wed Dec 07, 2019  1734 UA consistent with cystitis as a cause of the patient's sepsis.  We will plan to hospitalize for further antibiotic coverage and culture follow-up.  We will attempt to obtain a second culture if feasible.   [PS]  1813 Troponin is slightly elevated which I attribute to demand ischemia from tachycardia.  EKG is nonischemic and symptoms are not consistent with ACS.   [PS]    Clinical Course User Index [PS] Sharman CheekStafford, Stepfon Rawles, MD    ----------------------------------------- 6:44 PM on  12/07/2019 -----------------------------------------  Sepsis reassessment has been completed.  Heart rate improved to 90.  Case discussed with hospitalist.   ____________________________________________   FINAL CLINICAL IMPRESSION(S) / ED DIAGNOSES    Final diagnoses:  Cystitis  Sepsis with acute renal failure without septic shock, due to unspecified organism, unspecified acute renal failure type (HCC)  AKI (acute kidney injury) [facility](HCC)     ED Discharge Orders    None      Portions of this note were generated with dragon dictation software. Dictation errors Shutt occur despite best attempts at  proofreading.   Sharman Cheek, MD 12/07/19 [phone]

## 2019-12-07 NOTE — ED Notes (Signed)
Patient transported to X-ray 

## 2019-12-07 NOTE — ED Notes (Signed)
Admitting MD at bedside.

## 2019-12-07 NOTE — ED Notes (Signed)
Pt resting in bed, no complaints at this time, breathing is slightly labored with talking, expiratory wheezes noted. Pt states he is like this at home, on ra at home. Placed on 2l [place] and tolerating well, sats 100% , 1+pitting edema bilateral feet noted. Alert and oriented.

## 2019-12-07 NOTE — Progress Notes (Signed)
Pt arrived to the floor. Pt A & O x 4. Pt wearing 2 liters of O2 nasal cannula. O2 stats at 96%. Vitals stable. No complaints of pain. Admission questions assessment done. IV fluids started. Pt oriented to unit setup and routine. Pt resting comfortably. Will continue to monitor.

## 2019-12-07 NOTE — Sepsis Progress Note (Signed)
Notified bedside nurse of need to administer antibiotics.  

## 2019-12-07 NOTE — H&P (Addendum)
Luke Haynes NAT:[id number] DOB: 16-Jan-1941 DOA: 12/07/2019     PCP: Albina Billet, MD   Outpatient Specialists:  NONE    Patient arrived to ER on 12/07/19 at 1535  Patient coming from: home Lives  With family daughter in [place] and 2 young children    Chief Complaint:  Chief Complaint  Patient presents with  . Failure To Thrive    HPI: Luke Haynes is a 78 y.o. male with medical history significant of HTN, frequent UTIs    Presented with  Inability of ambulate frequent falls has been laying in bed for the past 2 to 3 days urinating and stooling on himself.  At his baseline he used to be able to ambulate Reports generalized weakness malaise fatigue unable to take p.o.'s for the past 2-3 days.  Too weak to get out of bed no fever Family called his primary care provider who recommended for patient to be brought into ER to work up sepsis.  Patient reports he has had a recent fall after and have had some right hip pain which has diminished his ability to ambulate.   Infectious risk factors:  Reports severe fatigue     In  ER RAPID COVID TEST  NEGATIVE   in house  PCR testing  Pending  No results found for: SARSCOV2NAA   Regarding pertinent Chronic problems:    Hyperlipidemia -  Not on statins   HTN on NOrvasc, bystolic, enalapril, HCTZ     BPH - On flomax  While in ER: Promptly tachycardic up to 105 with increased work of breathing     The following Work up has been ordered so far:  Orders Placed This Encounter  Procedures  . Critical Care  . Blood culture (routine single)  . Urine culture  . Blood culture (single)  . SARS CORONAVIRUS 2 (TAT 6-24 HRS) Nasopharyngeal Nasopharyngeal Swab  . DG Chest 1 View  . Lactic acid, plasma  . Comprehensive metabolic panel  . Lipase, blood  . CBC WITH DIFFERENTIAL  . Procalcitonin  . Urinalysis, Complete w Microscopic  . Brain natriuretic peptide  . Initiate Carrier Fluid Protocol  . In and Out Cath  . Refer to  Sidebar Report: Sepsis Sidebar ED/IP  . Document vital signs within 1-hour of fluid bolus completion and notify provider of bolus completion  . Document height and weight  . Insert peripheral IV x 2  . Initiate Code Sepsis ([facility] [phone]) Reason for Consult? tracking  . Consult to hospitalist  . POC SARS Coronavirus 2 Ag-ED - Nasal Swab (BD Veritor Kit)  . POC SARS Coronavirus 2 Ag  . ED EKG  . EKG 12-Lead  . EKG 12-Lead  . Saline lock IV     Following Medications were ordered in ER: Medications  cefTRIAXone (ROCEPHIN) 1 g in sodium chloride 0.9 % 100 mL IVPB (1 g Intravenous New Bag/Given 12/07/19 1811)  sodium chloride 0.9 % bolus 1,000 mL (0 mLs Intravenous Stopped 12/07/19 1802)  sodium chloride 0.9 % bolus 1,000 mL (1,000 mLs Intravenous New Bag/Given 12/07/19 1805)        Consult Orders  (From admission, onward)         Start     Ordered   12/07/19 1753  Consult to hospitalist  Once    Provider:  (Not yet assigned)  Question Answer Comment  Place call to: ED, [phone]   Reason for Consult Admit   Diagnosis/Clinical Info for Consult: UTI, sepsis  12/07/19 1752           Significant initial  Findings: Abnormal Labs Reviewed  COMPREHENSIVE METABOLIC PANEL - Abnormal; Notable for the following components:      Result Value   Sodium 128 (*)    Chloride 91 (*)    Glucose, Bld 136 (*)    BUN 45 (*)    Creatinine, Ser 1.62 (*)    Calcium 8.6 (*)    Albumin 2.9 (*)    AST 80 (*)    Total Bilirubin 1.5 (*)    GFR calc non Af Amer 40 (*)    GFR calc Af Amer 46 (*)    All other components within normal limits  CBC WITH DIFFERENTIAL/PLATELET - Abnormal; Notable for the following components:   WBC 21.5 (*)    Hemoglobin 12.4 (*)    HCT 35.3 (*)    Neutro Abs 19.1 (*)    Monocytes Absolute 1.4 (*)    Abs Immature Granulocytes 0.25 (*)    All other components within normal limits  URINALYSIS, COMPLETE (UACMP) WITH MICROSCOPIC - Abnormal; Notable for  the following components:   Color, Urine AMBER (*)    APPearance CLOUDY (*)    Hgb urine dipstick LARGE (*)    Protein, ur 100 (*)    Leukocytes,Ua MODERATE (*)    WBC, UA >50 (*)    Bacteria, UA MANY (*)    All other components within normal limits  TROPONIN I (HIGH SENSITIVITY) - Abnormal; Notable for the following components:   Troponin I (High Sensitivity) 45 (*)    All other components within normal limits    Otherwise labs showing:    Recent Labs  Lab 12/07/19 1630  NA 128*  K 3.7  CO2 23  GLUCOSE 136*  BUN 45*  CREATININE 1.62*  CALCIUM 8.6*    Cr   Up from baseline see below Lab Results  Component Value Date   CREATININE 1.62 (H) 12/07/2019   CREATININE 0.62 11/09/2019    Recent Labs  Lab 12/07/19 1630  AST 80*  ALT 38  ALKPHOS 67  BILITOT 1.5*  PROT 6.8  ALBUMIN 2.9*   Lab Results  Component Value Date   CALCIUM 8.6 (L) 12/07/2019     WBC      Component Value Date/Time   WBC 21.5 (H) 12/07/2019 1630   ANC    Component Value Date/Time   NEUTROABS 19.1 (H) 12/07/2019 1630   ALC No components found for: LYMPHAB    Plt: Lab Results  Component Value Date   PLT 284 12/07/2019    Lactic Acid, Venous    Component Value Date/Time   LATICACIDVEN 1.5 12/07/2019 1630    Procalcitonin  4.25  COVID-19 Labs  No results for input(s): DDIMER, FERRITIN, LDH, CRP in the last 72 hours.  No results found for: SARSCOV2NAA     HG/HCT  stable,      Component Value Date/Time   HGB 12.4 (L) 12/07/2019 1630   HCT 35.3 (L) 12/07/2019 1630    Recent Labs  Lab 12/07/19 1630  LIPASE 16   No results for input(s): AMMONIA in the last 168 hours.  No components found for: LABALBU   Troponin  45 Cardiac Panel (last 3 results) Recent Labs    12/07/19 1930  CKTOTAL 944*     ECG: Ordered Personally reviewed by me showing: HR : 105 Rhythm: NSR w PVC   no evidence of ischemic changes QTC 412   BNP (last  3 results) Recent Labs     11/09/19 1901  BNP 113.0*      UA   evidence of UTI      Urine analysis:    Component Value Date/Time   COLORURINE AMBER (A) 12/07/2019 1707   APPEARANCEUR CLOUDY (A) 12/07/2019 1707   LABSPEC 1.017 12/07/2019 1707   PHURINE 5.0 12/07/2019 1707   GLUCOSEU NEGATIVE 12/07/2019 1707   HGBUR LARGE (A) 12/07/2019 1707   BILIRUBINUR NEGATIVE 12/07/2019 1707   KETONESUR NEGATIVE 12/07/2019 1707   PROTEINUR 100 (A) 12/07/2019 1707   NITRITE NEGATIVE 12/07/2019 1707   LEUKOCYTESUR MODERATE (A) 12/07/2019 1707      Ordered    CXR -  NON acute  KUB -  NON acute   ED Triage Vitals  Enc Vitals Group     BP 12/07/19 1557 (!) 166/78     Pulse Rate 12/07/19 1557 (!) 103     Resp 12/07/19 1557 18     Temp 12/07/19 1557 99.8 F (37.7 C)     Temp Source 12/07/19 1557 Oral     SpO2 12/07/19 1557 96 %     Weight 12/07/19 1552 198 lb 6.6 oz (90 kg)     Height 12/07/19 1552 5' 9"  (1.753 m)     Head Circumference --      Peak Flow --      Pain Score 12/07/19 1551 0     Pain Loc --      Pain Edu? --      Excl. in [place]? --   TMAX(24)@       Latest  Blood pressure (!) 166/78, pulse (!) 103, temperature 99.8 F (37.7 C), temperature source Oral, resp. rate 18, height 5' 9"  (1.753 m), weight 90 kg, SpO2 96 %.     Hospitalist was called for admission for  AKI and UTI   Review of Systems:    Pertinent positives include:  fatigue  Constitutional:  No weight loss, night sweats, Fevers, chills,, weight loss  HEENT:  No headaches, Difficulty swallowing,Tooth/dental problems,Sore throat,  No sneezing, itching, ear ache, nasal congestion, post nasal drip,  Cardio-vascular:  No chest pain, Orthopnea, PND, anasarca, dizziness, palpitations.no Bilateral lower extremity swelling  GI:  No heartburn, indigestion, abdominal pain, nausea, vomiting, diarrhea, change in bowel habits, loss of appetite, melena, blood in stool, hematemesis Resp:  no shortness of breath at rest. No dyspnea on  exertion, No excess mucus, no productive cough, No non-productive cough, No coughing up of blood.No change in color of mucus.No wheezing. Skin:  no rash or lesions. No jaundice GU:  no dysuria, change in color of urine, no urgency or frequency. No straining to urinate.  No flank pain.  Musculoskeletal:  No joint pain or no joint swelling. No decreased range of motion. No back pain.  Psych:  No change in mood or affect. No depression or anxiety. No memory loss.  Neuro: no localizing neurological complaints, no tingling, no weakness, no double vision, no gait abnormality, no slurred speech, no confusion  All systems reviewed and apart from [place] all are negative  Past Medical History:   Past Medical History:  Diagnosis Date  . Hypertension       History reviewed. No pertinent surgical history.  Social History:  Ambulatory  independently  At baseline     reports that he has never smoked. He has never used smokeless tobacco. He reports previous alcohol use. He reports that he does not use drugs.   Family  History:   Family History  Problem Relation Age of Onset  . COPD Father     Allergies: Allergies  Allergen Reactions  . Sulfa Antibiotics      Prior to Admission medications   Not on File   Physical Exam: Blood pressure (!) 166/78, pulse (!) 103, temperature 99.8 F (37.7 C), temperature source Oral, resp. rate 18, height 5' 9"  (1.753 m), weight 90 kg, SpO2 96 %. 1. General:  in No Acute distress    Chronically ill  -appearing 2. Psychological: Alert and  Oriented 3. Head/ENT:    Dry Mucous Membranes                          Head Non traumatic, neck supple                            Poor Dentition 4. SKIN: decreased Skin turgor,  Skin clean Dry and intact no rash 5. Heart: Regular rate and rhythm no  Murmur, no Rub or gallop 6. Lungs  no wheezes or crackles   7. Abdomen: Soft,  non-tender  distended  Obese  8. Lower extremities: no clubbing, cyanosis, no   edema 9. Neurologically Grossly intact, moving all 4 extremities equally   10. MSK: Normal range of motion   All other LABS:     Recent Labs  Lab 12/07/19 1630  WBC 21.5*  NEUTROABS 19.1*  HGB 12.4*  HCT 35.3*  MCV 83.1  PLT 284     Recent Labs  Lab 12/07/19 1630  NA 128*  K 3.7  CL 91*  CO2 23  GLUCOSE 136*  BUN 45*  CREATININE 1.62*  CALCIUM 8.6*     Recent Labs  Lab 12/07/19 1630  AST 80*  ALT 38  ALKPHOS 67  BILITOT 1.5*  PROT 6.8  ALBUMIN 2.9*       Cultures: No results found for: SDES, SPECREQUEST, CULT, REPTSTATUS   Radiological Exams on Admission: DG Chest 1 View  Result Date: 12/07/2019 CLINICAL DATA:  78 year old male with weakness. EXAM: CHEST  1 VIEW COMPARISON:  Chest radiograph dated 11/10/2019 FINDINGS: The lungs are clear. There is no pleural effusion pneumothorax. The cardiac silhouette is within normal limits. Atherosclerotic calcification of the aortic arch. No acute osseous pathology. IMPRESSION: No active cardiopulmonary disease. Electronically Signed   By: Anner Crete M.D.   On: 12/07/2019 16:30    Chart has been reviewed    Assessment/Plan   78 y.o. male with medical history significant of HTN, frequent UTIs    Admitted for  UTi, AKI and dehydration  Present on Admission: . Acute lower UTI -  - treat with Rocephin         await results of urine culture and adjust antibiotic coverage as needed  . AKI (acute kidney injury) ([place]) - - likely secondary to dehydration,       check FeNA       Rehydrate with IV fluids      HOLD  ACE/ARBi and nephrotoxic medications  obtain bladder scan     obtain renal US in Am if no improvement    . Dehydration -  - we'll administer IV fluids and recheck orthostatics in the morning  . Essential hypertension - hold ACE, restart Norvasc And Bystolic hold hydrochlorothiazide  . Hyponatremia - - likely secondary to dehydration, will give IVF, check Urine Na, Cr, Osmolarity. Monitor Na  levels  to avoid over aggressive correction. Check TSH. Stop offending medications. If no improvement with IVF will initiate further work up for SIADH if appropriate.   . Debility - will need PT to eval no evidence of hip fracture on KUB if persistent pain [place] need further imaging  . Rhabdomyolysis - will rehydrate and recheck CK in AM  . Elevated troponin -  -no chest pain no EKG changes in the setting of AKI, rhabdo, dehydration and infection likely due to demand ischemia and poor clearance, monitor on telemetry and cycle cardiac enzymes to trend.  if continues to rise will need further work-up   Sepsis -  -SIRS criteria met with elevated white blood cell count,  tachycardia ,     With evidence of end organ damage such as  acute renal failure,  -Most likely source being: urinary   - Obtain serial lactic acid and procalcitonin level.  - Initiate IV antibiotics   - await results of blood and urine culture  - Rehydrate   Other plan as per orders.  DVT prophylaxis:  Lovenox     Code Status:  FULL CODE as per patient   I had personally discussed CODE STATUS with patient    Family Communication:   Family not at  Bedside    Disposition Plan:    likely will need placement for rehabilitation                                          Would benefit from PT/OT eval prior to DC  Ordered                   Swallow eval - SLP ordered                   Social Work  consulted                   Nutrition    consulted                   Consults called: none but if abnormal echo would need cardiology consult   Admission status:  ED Disposition    ED Disposition Condition [place]: [place] [[id number]]  Level of Care: Telemetry [5]  Covid Evaluation: Asymptomatic Screening Protocol (No Symptoms)  Diagnosis: AKI (acute kidney injury) [facility]) [[id number]]  Admitting Physician: Toy Baker [[id number]]  Attending Physician: Toy Baker [[id number]]        Obs       Level of care    tele  For  24H         Precautions:   Covid Negative  No active isolations     PPE: Used by the provider:   P100  eye Goggles,  Gloves       Eupha Lobb 12/07/2019, 9:20 PM    [facility]     after 2 AM please page floor coverage PA If 7AM-7PM, please contact the day team taking care of the patient using [email or web address]

## 2019-12-07 NOTE — ED Notes (Signed)
Pt changed and cleaned of dried stool with this RN and Elmyra Ricks, NT. Pt given new brief and new sheet. Pt states that he normally can tell when he has to urinate but hasn't been able to since he's been falling when he walks.

## 2019-12-08 ENCOUNTER — Observation Stay
Admit: 2019-12-08 | Discharge: 2019-12-08 | Disposition: A | Discharge status: Home / Self Care | Payer: Medicare HMO | Attending: Internal Medicine | Admitting: Internal Medicine

## 2019-12-08 DIAGNOSIS — M6282 Rhabdomyolysis: Secondary | ICD-10-CM | POA: Diagnosis present

## 2019-12-08 DIAGNOSIS — R627 Adult failure to thrive: Secondary | ICD-10-CM | POA: Diagnosis present

## 2019-12-08 DIAGNOSIS — I248 Other forms of acute ischemic heart disease: Secondary | ICD-10-CM | POA: Diagnosis present

## 2019-12-08 DIAGNOSIS — I119 Hypertensive heart disease without heart failure: Secondary | ICD-10-CM | POA: Diagnosis present

## 2019-12-08 DIAGNOSIS — N390 Urinary tract infection, site not specified: Secondary | ICD-10-CM | POA: Diagnosis not present

## 2019-12-08 DIAGNOSIS — E860 Dehydration: Secondary | ICD-10-CM | POA: Diagnosis present

## 2019-12-08 DIAGNOSIS — Z87440 Personal history of urinary (tract) infections: Secondary | ICD-10-CM | POA: Diagnosis not present

## 2019-12-08 DIAGNOSIS — R5381 Other malaise: Secondary | ICD-10-CM | POA: Diagnosis present

## 2019-12-08 DIAGNOSIS — R296 Repeated falls: Secondary | ICD-10-CM | POA: Diagnosis present

## 2019-12-08 DIAGNOSIS — R6520 Severe sepsis without septic shock: Secondary | ICD-10-CM

## 2019-12-08 DIAGNOSIS — Z882 Allergy status to sulfonamides status: Secondary | ICD-10-CM | POA: Diagnosis not present

## 2019-12-08 DIAGNOSIS — A4151 Sepsis due to Escherichia coli [E. coli]: Secondary | ICD-10-CM | POA: Diagnosis present

## 2019-12-08 DIAGNOSIS — N400 Enlarged prostate without lower urinary tract symptoms: Secondary | ICD-10-CM | POA: Diagnosis present

## 2019-12-08 DIAGNOSIS — N3090 Cystitis, unspecified without hematuria: Secondary | ICD-10-CM | POA: Diagnosis present

## 2019-12-08 DIAGNOSIS — Z79899 Other long term (current) drug therapy: Secondary | ICD-10-CM | POA: Diagnosis not present

## 2019-12-08 DIAGNOSIS — Z20828 Contact with and (suspected) exposure to other viral communicable diseases: Secondary | ICD-10-CM | POA: Diagnosis present

## 2019-12-08 DIAGNOSIS — A419 Sepsis, unspecified organism: Secondary | ICD-10-CM

## 2019-12-08 DIAGNOSIS — E785 Hyperlipidemia, unspecified: Secondary | ICD-10-CM | POA: Diagnosis present

## 2019-12-08 DIAGNOSIS — E871 Hypo-osmolality and hyponatremia: Secondary | ICD-10-CM | POA: Diagnosis present

## 2019-12-08 DIAGNOSIS — N179 Acute kidney failure, unspecified: Secondary | ICD-10-CM | POA: Diagnosis present

## 2019-12-08 LAB — COMPREHENSIVE METABOLIC PANEL
ALT: 34 U/L (ref 0–44)
AST: 60 U/L — ABNORMAL HIGH (ref 15–41)
Albumin: 2.3 g/dL — ABNORMAL LOW (ref 3.5–5.0)
Alkaline Phosphatase: 52 U/L (ref 38–126)
Anion gap: 12 (ref 5–15)
BUN: 40 mg/dL — ABNORMAL HIGH (ref 8–23)
CO2: 25 mmol/L (ref 22–32)
Calcium: 8 mg/dL — ABNORMAL LOW (ref 8.9–10.3)
Chloride: 100 mmol/L (ref 98–111)
Creatinine, Ser: 1.38 mg/dL — ABNORMAL HIGH (ref 0.61–1.24)
GFR calc Af Amer: 56 mL/min — ABNORMAL LOW (ref >60)
GFR calc non Af Amer: 49 mL/min — ABNORMAL LOW (ref >60)
Glucose, Bld: 138 mg/dL — ABNORMAL HIGH (ref 70–99)
Potassium: 3.3 mmol/L — ABNORMAL LOW (ref 3.5–5.1)
Sodium: 137 mmol/L (ref 135–145)
Total Bilirubin: 1 mg/dL (ref 0.3–1.2)
Total Protein: 5.6 g/dL — ABNORMAL LOW (ref 6.5–8.1)

## 2019-12-08 LAB — CBC
HCT: 31.8 % — ABNORMAL LOW (ref 39.0–52.0)
Hemoglobin: 10.8 g/dL — ABNORMAL LOW (ref 13.0–17.0)
MCH: 29.6 pg (ref 26.0–34.0)
MCHC: 34 g/dL (ref 30.0–36.0)
MCV: 87.1 fL (ref 80.0–100.0)
Platelets: 267 10*3/uL (ref 150–400)
RBC: 3.65 MIL/uL — ABNORMAL LOW (ref 4.22–5.81)
RDW: 12.2 % (ref 11.5–15.5)
WBC: 18.2 10*3/uL — ABNORMAL HIGH (ref 4.0–10.5)
nRBC: 0 % (ref 0.0–0.2)

## 2019-12-08 LAB — MAGNESIUM: Magnesium: 2.4 mg/dL (ref 1.7–2.4)

## 2019-12-08 LAB — ECHOCARDIOGRAM COMPLETE
Height: 69 in
Weight: 3174.62 oz

## 2019-12-08 LAB — TROPONIN I (HIGH SENSITIVITY): Troponin I (High Sensitivity): 49 ng/L — ABNORMAL HIGH (ref <18)

## 2019-12-08 LAB — TSH: TSH: 1.287 u[IU]/mL (ref 0.350–4.500)

## 2019-12-08 LAB — CK: Total CK: 460 U/L — ABNORMAL HIGH (ref 49–397)

## 2019-12-08 LAB — PHOSPHORUS: Phosphorus: 3.2 mg/dL (ref 2.5–4.6)

## 2019-12-08 MED ORDER — ADULT MULTIVITAMIN W/MINERALS CH
1.0000 | ORAL_TABLET | Freq: Every day | ORAL | Status: DC
  Administered 2019-12-08 – 2019-12-11 (×4): 1 via ORAL
  Filled 2019-12-08 (×4): qty 1

## 2019-12-08 MED ORDER — AMLODIPINE BESYLATE 5 MG PO TABS
5.0000 mg | ORAL_TABLET | Freq: Every day | ORAL | Status: DC
  Administered 2019-12-08 – 2019-12-10 (×3): 5 mg via ORAL
  Filled 2019-12-08 (×3): qty 1

## 2019-12-08 MED ORDER — ENSURE ENLIVE PO LIQD
237.0000 mL | Freq: Two times a day (BID) | ORAL | Status: DC
  Administered 2019-12-08 – 2019-12-11 (×8): 237 mL via ORAL

## 2019-12-08 MED ORDER — SODIUM CHLORIDE 0.9 % IV SOLN
2.0000 g | INTRAVENOUS | Status: DC
  Administered 2019-12-08: 2 g via INTRAVENOUS
  Filled 2019-12-08: qty 20
  Filled 2019-12-08: qty 2

## 2019-12-08 MED ORDER — POTASSIUM CHLORIDE CRYS ER 20 MEQ PO TBCR
20.0000 meq | EXTENDED_RELEASE_TABLET | Freq: Once | ORAL | Status: AC
  Administered 2019-12-08: 09:00:00 20 meq via ORAL
  Filled 2019-12-08: qty 1

## 2019-12-08 NOTE — Progress Notes (Signed)
D: Pt alert and oriented. Pt denies experiencing any pain at this time. Pt grows restless at the end of the day thinking he heard his daughter and was going to attempt to get out of bed. Pt was reoriented and redirected to stay in the bed as well as given therapeutic communication. Pt is calm and cooperative. Pt also given tylenol twice today to low fever. Once given tylenol fever declined.  A: Scheduled medications administered to pt, per MD orders. Support and encouragement provided. Frequent verbal contact made.  R: No adverse drug reactions noted. Pt complaint with medications and treatment plan. Pt interacts well with staff on the unit. Pt is stable at this time, will continue to monitor and provide care for as ordered.

## 2019-12-08 NOTE — Evaluation (Signed)
Physical Therapy Evaluation Patient Details Name: Luke Haynes MRN: [id number] DOB: 11/03/1941 Today's Date: 12/08/2019   History of Present Illness  78 y.o. male with medical history significant of HTN, frequent UTIs. Presented with inability to ambulate with frequent falls and has been laying in bed for the past 2 to 3 days urinating and stooling on himself.  At his baseline he used to be able to ambulate. Reports generalized weakness, malaise, fatigue, unable to take p.o.'s for the past 2-3 days. Too weak to get out of bed,  no fever. Family called his PCP who recommended for patient to be brought into ER to work up sepsis.  Clinical Impression  Pt did well with PT and is not far from his baseline.  Despite recently being very weak and essentially bed bound he did well today walking >200 ft (most of which was w/o AD and w/o safety issue) and though he had some minimal fatigue his O2 remained in the 90s on room air with the effort.  Pt showed functional and strength and mobility, reports that normally he is able to be quite active, his daughter-in-law is home 24/7 and after doing well today he is confident that he can go home safely and get back to his baseline.     Follow Up Recommendations No PT follow up    Equipment Recommendations  None recommended by PT    Recommendations for Other Services       Precautions / Restrictions Precautions Precautions: Fall Restrictions Weight Bearing Restrictions: No      Mobility  Bed Mobility Overal bed mobility: Modified Independent Bed Mobility: Supine to Sit     Supine to sit: Supervision Sit to supine: Supervision   General bed mobility comments: use of bed rails but no physical assist to complete  Transfers Overall transfer level: Modified independent Equipment used: Rolling walker (2 wheeled) Transfers: Sit to/from Stand Sit to Stand: Min guard         General transfer comment: Pt able to rise to standing w/o assist and with  good confidence, expected amount of UE use  Ambulation/Gait Ambulation/Gait assistance: Min guard Gait Distance (Feet): 225 Feet Assistive device: Rolling walker (2 wheeled);None       General Gait Details: Pt using walker first ~100 ft then no AD for the remainder of the effort. No LOBs, excessive fatigue and reports being near his baseline.  He did have small step length and toe clearance, cues for safely increasing these but no LOBs or overt safety issues.   Stairs            Wheelchair Mobility    Modified Rankin (Stroke Patients Only)       Balance Overall balance assessment: Modified Independent Sitting-balance support: No upper extremity supported;Feet supported Sitting balance-Leahy Scale: Good     Standing balance support: Bilateral upper extremity supported Standing balance-Leahy Scale: Good Standing balance comment: No overt LOBs, minimal unsteadiness with dynamic/ambulation tasks                             Pertinent Vitals/Pain Pain Assessment: No/denies pain    Home Living Family/patient expects to be discharged to:: Private residence Living Arrangements: Children;Other relatives Available Help at Discharge: Family Type of Home: House         Home Equipment: None      Prior Function Level of Independence: Independent         Comments: Pt indep with  mobility ADL, frequent recent falls.     Hand Dominance   Dominant Hand: Right    Extremity/Trunk Assessment   Upper Extremity Assessment Upper Extremity Assessment: Overall WFL for tasks assessed    Lower Extremity Assessment Lower Extremity Assessment: Overall WFL for tasks assessed    Cervical / Trunk Assessment Cervical / Trunk Assessment: Normal  Communication   Communication: No difficulties  Cognition Arousal/Alertness: Awake/alert Behavior During Therapy: WFL for tasks assessed/performed Overall Cognitive Status: Within Functional Limits for tasks assessed                                  General Comments: A&Ox4, follows commands      General Comments General comments (skin integrity, edema, etc.): pt on room air t/o the session, sats in the mid to low 90s, quickly back up to 96% (on RA) in sitting post activity    Exercises Other Exercises Other Exercises: Pt educated in falls prevention strategies, benefits of a shower chair, and functional transfer training for ADL   Assessment/Plan    PT Assessment Patent does not need any further PT services  PT Problem List         PT Treatment Interventions      PT Goals (Current goals can be found in the Care Plan section)  Acute Rehab PT Goals Patient Stated Goal: get better and go home    Frequency     Barriers to discharge        Co-evaluation               AM-PAC PT "6 Clicks" Mobility  Outcome Measure Help needed turning from your back to your side while in a flat bed without using bedrails?: None Help needed moving from lying on your back to sitting on the side of a flat bed without using bedrails?: None Help needed moving to and from a bed to a chair (including a wheelchair)?: None Help needed standing up from a chair using your arms (e.g., wheelchair or bedside chair)?: None Help needed to walk in hospital room?: None Help needed climbing 3-5 steps with a railing? : None 6 Click Score: 24    End of Session Equipment Utilized During Treatment: Gait belt Activity Tolerance: Patient tolerated treatment well Patient left: with chair alarm set;with call bell/phone within reach Nurse Communication: Mobility status PT Visit Diagnosis: Muscle weakness (generalized) (M62.81);Difficulty in walking, not elsewhere classified (R26.2)    Time: 5329-9242 PT Time Calculation (min) (ACUTE ONLY): 25 min   Charges:   PT Evaluation $PT Eval Low Complexity: 1 Low PT Treatments $Gait Training: 8-22 mins        Malachi Pro, DPT 12/08/2019, 1:12 PM

## 2019-12-08 NOTE — Progress Notes (Signed)
PROGRESS NOTE    Luke Haynes  KVQ:[id number] DOB: 25-Mar-1941 DOA: 12/07/2019 PCP: Albina Billet, MD    Brief Narrative:  Luke Haynes is a 78 y.o. male with medical history significant of HTN, frequent UTIs Presented with  Inability of ambulate frequent falls has been laying in bed for the past 2 to 3 days urinating and stooling on himself.  At his baseline he used to be able to ambulate Reported generalized weakness malaise fatigue unable to take p.o.'s for the past 2-3 days.  Too weak to get out of bed no fever Family called his primary care provider who recommended for patient to be brought into ER to work up sepsis. Patient reported he has had a recent fall after and have had some right hip pain which has diminished his ability to ambulate.  Covid test negative     Consultants:   None  Procedures: Echo  Antimicrobials:   Rocephin   Subjective: Patient seen and examined.  Denies any shortness of breath or chest pain.  Did report some urinary symptoms but he says is improving.  Objective: Vitals:   12/08/19 0432 12/08/19 0824 12/08/19 0951 12/08/19 1133  BP: 128/65 131/73    Pulse: 89 93 93 83  Resp: 18     Temp: 98.6 F (37 C)  98 F (36.7 C)   TempSrc: Oral  Oral   SpO2: 92%  97% 95%  Weight:      Height:        Intake/Output Summary (Last 24 hours) at 12/08/2019 1643 Last data filed at 12/08/2019 1300 Gross per 24 hour  Intake 1921.85 ml  Output --  Net 1921.85 ml   Filed Weights   12/07/19 1552  Weight: 90 kg    Examination:  General exam: Appears calm and comfortable, NAD Respiratory system: Clear to auscultation. Respiratory effort normal. Cardiovascular system: S1 & S2 heard, RRR. No JVD, murmurs, rubs, gallops or clicks.  Gastrointestinal system: Abdomen is nondistended, soft and nontender. Normal bowel sounds heard. Central nervous system: Alert and oriented. No focal neurological deficits. Extremities: No edema Skin: Dry,  warm Psychiatry:  Mood & affect appropriate.     Data Reviewed: I have personally reviewed following labs and imaging studies  CBC: Recent Labs  Lab 12/07/19 1630 12/08/19 0528  WBC 21.5* 18.2*  NEUTROABS 19.1*  --   HGB 12.4* 10.8*  HCT 35.3* 31.8*  MCV 83.1 87.1  PLT 284 643   Basic Metabolic Panel: Recent Labs  Lab 12/07/19 1630 12/07/19 1930 12/08/19 0528  NA 128*  --  137  K 3.7  --  3.3*  CL 91*  --  100  CO2 23  --  25  GLUCOSE 136*  --  138*  BUN 45*  --  40*  CREATININE 1.62*  --  1.38*  CALCIUM 8.6*  --  8.0*  MG  --  2.2 2.4  PHOS  --  2.2* 3.2   GFR: Estimated Creatinine Clearance: 48.9 mL/min (A) (by C-G formula based on SCr of 1.38 mg/dL (H)). Liver Function Tests: Recent Labs  Lab 12/07/19 1630 12/08/19 0528  AST 80* 60*  ALT 38 34  ALKPHOS 67 52  BILITOT 1.5* 1.0  PROT 6.8 5.6*  ALBUMIN 2.9* 2.3*   Recent Labs  Lab 12/07/19 1630  LIPASE 16   No results for input(s): AMMONIA in the last 168 hours. Coagulation Profile: No results for input(s): INR, PROTIME in the last 168 hours. Cardiac Enzymes: Recent  Labs  Lab 12/07/19 1930 12/08/19 0528  CKTOTAL 944* 460*   BNP (last 3 results) No results for input(s): PROBNP in the last 8760 hours. HbA1C: No results for input(s): HGBA1C in the last 72 hours. CBG: No results for input(s): GLUCAP in the last 168 hours. Lipid Profile: No results for input(s): CHOL, HDL, LDLCALC, TRIG, CHOLHDL, LDLDIRECT in the last 72 hours. Thyroid Function Tests: Recent Labs    12/08/19 0528  TSH 1.287   Anemia Panel: No results for input(s): VITAMINB12, FOLATE, FERRITIN, TIBC, IRON, RETICCTPCT in the last 72 hours. Sepsis Labs: Recent Labs  Lab 12/07/19 1630  PROCALCITON 4.25  LATICACIDVEN 1.5    Recent Results (from the past 240 hour(s))  Blood culture (routine single)     Status: None (Preliminary result)   Collection Time: 12/07/19  4:30 PM   Specimen: BLOOD  Result Value Ref Range  Status   Specimen Description BLOOD LEFT ANTECUBITAL  Final   Special Requests   Final    BOTTLES DRAWN AEROBIC AND ANAEROBIC Blood Culture adequate volume   Culture   Final    NO GROWTH < 24 HOURS Performed at [facility], [address] [place], [place] [address]    Report Status PENDING  Incomplete  Blood culture (single)     Status: None (Preliminary result)   Collection Time: 12/07/19  6:22 PM   Specimen: BLOOD  Result Value Ref Range Status   Specimen Description BLOOD BLOOD LEFT ARM  Final   Special Requests   Final    BOTTLES DRAWN AEROBIC AND ANAEROBIC Blood Culture adequate volume   Culture   Final    NO GROWTH < 24 HOURS Performed at [facility], [address], [place], [place] [address]    Report Status PENDING  Incomplete  SARS CORONAVIRUS 2 (TAT 6-24 HRS) Nasopharyngeal Nasopharyngeal Swab     Status: None   Collection Time: 12/07/19  6:22 PM   Specimen: Nasopharyngeal Swab  Result Value Ref Range Status   SARS Coronavirus 2 NEGATIVE NEGATIVE Final    Comment: (NOTE) SARS-CoV-2 target nucleic acids are NOT DETECTED. The SARS-CoV-2 RNA is generally detectable in upper and lower respiratory specimens during the acute phase of infection. Negative results do not preclude SARS-CoV-2 infection, do not rule out co-infections with other pathogens, and should not be used as the sole basis for treatment or other patient management decisions. Negative results must be combined with clinical observations, patient history, and epidemiological information. The expected result is Negative. Fact Sheet for Patients: SugarRoll.be Fact Sheet for Healthcare Providers: https://www.woods-mathews.com/ This test is not yet approved or cleared by the [place] FDA and  has been authorized for detection and/or diagnosis of SARS-CoV-2 by FDA under an Emergency Use Authorization (EUA). This EUA will remain  in effect  (meaning this test can be used) for the duration of the COVID-19 declaration under Section 56 4(b)(1) of the Act, 21 U.S.C. section 360bbb-3(b)(1), unless the authorization is terminated or revoked sooner. Performed at [facility], [place] [address], [place], [place] [address]          Radiology Studies: DG Chest 1 View  Result Date: 12/07/2019 CLINICAL DATA:  78 year old male with weakness. EXAM: CHEST  1 VIEW COMPARISON:  Chest radiograph dated 11/10/2019 FINDINGS: The lungs are clear. There is no pleural effusion pneumothorax. The cardiac silhouette is within normal limits. Atherosclerotic calcification of the aortic arch. No acute osseous pathology. IMPRESSION: No active cardiopulmonary disease. Electronically Signed  By: Anner Crete M.D.   On: 12/07/2019 16:30   DG Abd 1 View  Result Date: 12/07/2019 CLINICAL DATA:  Sepsis EXAM: ABDOMEN - 1 VIEW COMPARISON:  None. FINDINGS: No dilated loops of large or small bowel. Gas and stool in the rectum. No pathologic calcifications. No organomegaly. No acute osseous abnormality IMPRESSION: No acute abdominal findings. Electronically Signed   By: Suzy Bouchard M.D.   On: 12/07/2019 20:19        Scheduled Meds: . ALPRAZolam  0.5 mg Oral TID  . enoxaparin (LOVENOX) injection  40 mg Subcutaneous Q24H  . feeding supplement (ENSURE ENLIVE)  237 mL Oral BID BM  . multivitamin with minerals  1 tablet Oral Daily  . nebivolol  5 mg Oral Daily  . tamsulosin  0.4 mg Oral Daily   Continuous Infusions: . cefTRIAXone (ROCEPHIN)  IV Stopped (12/07/19 1900)    Assessment & Plan:   Active Problems:   Acute lower UTI   AKI (acute kidney injury) ([place])   Dehydration   Essential hypertension   Hyponatremia   Debility   Rhabdomyolysis   Elevated troponin   Sepsis ([place])   . Acute lower UTI -  -  treat with Rocephin  , increase dose to 2gm dail Follow-up urine culture Wbc decreasing  . AKI (acute kidney injury) ([place]) - -  likely secondary to dehydration,  Improved with hydration Encouraged po intake Monitor labs HOLD  ACE/ARBi and nephrotoxic medications      . Dehydration -  - Improving with ivf. Now encourage po intake  . Essential hypertension - hold ACE,  restart Norvasc at 72m po daily Continue Bystolic  hold hydrochlorothiazide  . Hyponatremia - - likely secondary to dehydration, Improved with iv hydration D/c ivf since increased little more rapidly   . Debility - will need PT to eval no evidence of hip fracture on KUB if persistent pain [place] need further imaging  . Rhabdomyolysis -  Ck trending down Was hydrated  . Elevated troponin -  -no chest pain no EKG changes in the setting of AKI, rhabdo, dehydration and infection likely due to demand ischemia and poor clearance, monitor on telemetry and cycle cardiac enzymes to trend.  if continues to rise will need further work-up   Sepsis -  -SIRS criteria met with elevated white blood cell count,  tachycardia ,     With evidence of end organ damage such as  acute renal failure,  -Most likely source being: urinary F/u cultures Cbc trending down     DVT prophylaxis:  Lovenox     Code Status:FUll Family Communication: none. CAgnes Lawrence(on chart) no answer.  Disposition Plan: likely d/c in 2 days. Needs Home OT       LOS: 0 days   Time spent: 45 minutes with more than 50% COC    SNolberto Hanlon MD [facility] Pager [phone]  If 7PM-7AM, please contact night-coverage [email or web address] Password [email or web address]12/24/2020, 4:43 PM

## 2019-12-08 NOTE — Progress Notes (Signed)
Daughter Raquel Sarna Rafalski called this morning to find out status of father phone number [phone]

## 2019-12-08 NOTE — Evaluation (Signed)
Clinical/Bedside Swallow Evaluation Patient Details  Name: Luke Haynes MRN: [id number] Date of Birth: 1941-09-11  Today's Date: 12/08/2019 Time: SLP Start Time (ACUTE ONLY): 0945 SLP Stop Time (ACUTE ONLY): 1030 SLP Time Calculation (min) (ACUTE ONLY): 45 min  Past Medical History:  Past Medical History:  Diagnosis Date  . Hypertension    Past Surgical History: History reviewed. No pertinent surgical history. HPI:  Pt is a 78 y.o. male with medical history significant of HTN, frequent UTIs. Pt also stated he had a "hiatal hernia" for "many years". It "gives me trouble sometimes like food is stuck in my chest".  He presented with inability to ambulate with frequent falls and has been laying in bed for the past 2 to 3 days urinating and stooling on himself.  At his baseline he used to be able to ambulate. Reports generalized weakness, malaise, fatigue, unable to take p.o.'s for the past 2-3 days. Too weak to get out of bed,  no fever. Family called his PCP who recommended for patient to be brought into ER to work up sepsis.  Pt A/O x4 now; verbally conversive and following instructions.    Assessment / Plan / Recommendation Clinical Impression  Pt appears to present w/ adequate oropharyngeal phase swallowing function w/ No overt clinical s/s of aspiration noted during his breakfast meal/po trials. Pt appears at reduced risk for aspiration w/ general precautions. Pt fed self w/ setup and positioning support - discussed importance of positioning upright. Pt consumed trials of thin liquids, purees, and soft solids w/ no overt s/s of aspiration noted; clear vocal quality and no decline in respiratory status from baseline. Pt appeared min SOB w/ the exertion of moving about in bed, talking. Encouraged pt to take Rest Breaks to lessen SOB from exertion at meal. Also encouraged Rest Breaks to allow for digestion - pt reported Baseline h/o a Hiatal Hernia w/ "food getting stuck in my chest" feelings -  globus. Oral phase appeared wfl for liquids and purees; min increased time to gum/mash increased texture d/t Edentulous status baseline. Educated pt on choosing soft, cooked foods and taking small bites; alternating foods/liquids to aid clearing of Esophagus. OM exam was [facility]. Pt fed self w/ setup.  Recommend a Mech Soft diet w/ thin liquids(easier mastication d/t Edentulous status); general aspiration precautions; REFLUX precautions. Pills are being tolerated w/ liquids per NSG. No further skilled ST services indicated currently.  SLP Visit Diagnosis: Dysphagia, unspecified (R13.10)(Edentulous status baseline; GI dysmotility per pt report)    Aspiration Risk  (reduced following general precs)    Diet Recommendation  Mech Soft diet for easier mastication(Edentulous status baseline); Thin liquids. General aspiration precautions. Reflux precautions.  Medication Administration: Whole meds with liquid(or whole in puree if needed)    Other  Recommendations Recommended Consults: Consider GI evaluation;Consider esophageal assessment(as Outpatient per pt wishes re: Hiatal Hernia(?)) Oral Care Recommendations: Oral care BID;Patient independent with oral care Other Recommendations: (n/a)   Follow up Recommendations None      Frequency and Duration (n/a)  (n/a)       Prognosis Prognosis for Safe Diet Advancement: Good Barriers to Reach Goals: (n/a)      Swallow Study   General Date of Onset: 12/07/19 HPI: Pt is a 78 y.o. male with medical history significant of HTN, frequent UTIs. Pt also stated he had a "hiatal hernia" for "many years". It "gives me trouble sometimes like food is stuck in my chest".  He presented with inability to ambulate with frequent  falls and has been laying in bed for the past 2 to 3 days urinating and stooling on himself.  At his baseline he used to be able to ambulate. Reports generalized weakness, malaise, fatigue, unable to take p.o.'s for the past 2-3 days. Too weak to  get out of bed,  no fever. Family called his PCP who recommended for patient to be brought into ER to work up sepsis.  Pt A/O x4 now; verbally conversive and following instructions.  Type of Study: Bedside Swallow Evaluation Previous Swallow Assessment: none Diet Prior to this Study: Regular;Thin liquids Temperature Spikes Noted: No(wbc 18.2 declining from admission) Respiratory Status: Nasal cannula(2L) History of Recent Intubation: No Behavior/Cognition: Alert;Cooperative;Pleasant mood Oral Cavity Assessment: Within Functional Limits Oral Care Completed by SLP: Recent completion by staff Oral Cavity - Dentition: Edentulous(does not wear dentures) Vision: Functional for self-feeding Self-Feeding Abilities: Able to feed self;Needs set up(min) Patient Positioning: Upright in bed(supported) Baseline Vocal Quality: Normal Volitional Cough: Strong Volitional Swallow: Able to elicit    Oral/Motor/Sensory Function Overall Oral Motor/Sensory Function: Within functional limits   Ice Chips Ice chips: Within functional limits Presentation: Spoon(fed; 1 trial)   Thin Liquid Thin Liquid: Within functional limits Presentation: Cup;Self Fed;Straw(4 trials via each during the meal)    Nectar Thick Nectar Thick Liquid: Not tested   Honey Thick Honey Thick Liquid: Not tested   Puree Puree: Within functional limits Presentation: Spoon;Self Fed(3 trials)   Solid     Solid: Impaired Presentation: Self Fed;Spoon(6 trials; 2 of sausage) Oral Phase Impairments: Impaired mastication(Edentulous baseline) Oral Phase Functional Implications: Impaired mastication(Edentulous baseline) Pharyngeal Phase Impairments: (none)       Jerilynn Som, MS, CCC-SLP Luke Haynes 12/08/2019,12:19 PM

## 2019-12-08 NOTE — Evaluation (Signed)
Occupational Therapy Evaluation Patient Details Name: Luke GandyDwight J Bardsley MRN: [id number] DOB: 06/10/1941 Today's Date: 12/08/2019    History of Present Illness 78 y.o. male with medical history significant of HTN, frequent UTIs. Presented with inability to ambulate with frequent falls and has been laying in bed for the past 2 to 3 days urinating and stooling on himself.  At his baseline he used to be able to ambulate. Reports generalized weakness, malaise, fatigue, unable to take p.o.'s for the past 2-3 days. Too weak to get out of bed,  no fever. Family called his PCP who recommended for patient to be brought into ER to work up sepsis.   Clinical Impression   Pt seen for OT evaluation this date. Prior to hospital admission, pt was ambulating and indep with ADL tasks. Endorses recent falls 2/2 UTI.  Pt lives with daughter in law and her 2 5yo twin children. Currently pt demonstrates impairments in strength, cardiopulmonary status (on 2L O2 during session), and balance requiring Min A for LB ADL and CGA for functional ADL transfers and mobility using a 2WW (pt does not use AD at home). Pt would benefit from skilled OT to address noted impairments and functional limitations (see below for any additional details) in order to maximize safety and independence while minimizing falls risk and caregiver burden.  Upon hospital discharge, [facility] for all ADL mobility.     Follow Up Recommendations  Home health OT;Supervision - Intermittent(supervision for OOB/mobility/ADL)    Equipment Recommendations  3 in 1 bedside commode;Other (comment)(2WW per PT)    Recommendations for Other Services       Precautions / Restrictions Precautions Precautions: Fall Restrictions Weight Bearing Restrictions: No      Mobility Bed Mobility Overal bed mobility: Needs Assistance Bed Mobility: Supine to Sit;Sit to Supine     Supine to sit: Supervision Sit to supine:  Supervision   General bed mobility comments: additional effort and BUE support but no physical assist to complete  Transfers Overall transfer level: Needs assistance Equipment used: Rolling walker (2 wheeled) Transfers: Sit to/from Stand Sit to Stand: Min guard         General transfer comment: cues for scooting forward and for hand placement    Balance Overall balance assessment: Needs assistance Sitting-balance support: No upper extremity supported;Feet supported Sitting balance-Leahy Scale: Good     Standing balance support: Bilateral upper extremity supported Standing balance-Leahy Scale: Fair Standing balance comment: mildly unsteady                           ADL either performed or assessed with clinical judgement   ADL Overall ADL's : Needs assistance/impaired Eating/Feeding: Independent   Grooming: Independent   Upper Body Bathing: Sitting;Set up;Supervision/ safety   Lower Body Bathing: Sit to/from stand;Minimal assistance   Upper Body Dressing : Sitting;Supervision/safety;Set up   Lower Body Dressing: Sit to/from stand;Minimal assistance   Toilet Transfer: Ambulation;RW;BSC;Min guard                   Vision Baseline Vision/History: Wears glasses Wears Glasses: Reading only Patient Visual Report: No change from baseline       Perception     Praxis      Pertinent Vitals/Pain Pain Assessment: No/denies pain(pt denies pain, reports "soreness" of R hip)     Hand Dominance Right   Extremity/Trunk Assessment Upper Extremity Assessment Upper Extremity Assessment: Overall WFL for tasks  assessed(grossly at least 4/5 bilat)   Lower Extremity Assessment Lower Extremity Assessment: Defer to PT evaluation;Generalized weakness(grossly at least 3+/5, R hip discomfort with MMT)   Cervical / Trunk Assessment Cervical / Trunk Assessment: Normal   Communication Communication Communication: No difficulties   Cognition Arousal/Alertness:  Awake/alert Behavior During Therapy: WFL for tasks assessed/performed Overall Cognitive Status: Within Functional Limits for tasks assessed                                 General Comments: A&Ox4, follows commands   General Comments  orthostatic vitals taken (please see flowsheets)    Exercises Other Exercises Other Exercises: Pt educated in falls prevention strategies, benefits of a shower chair, and functional transfer training for ADL   Shoulder Instructions      Home Living Family/patient expects to be discharged to:: Private residence Living Arrangements: Children;Other relatives(daughter in law and 2 5yo twin children) Available Help at Discharge: Family Type of Home: House             Bathroom Shower/Tub: Chief Strategy Officer: Standard     Home Equipment: None          Prior Functioning/Environment Level of Independence: Independent        Comments: Pt indep with mobility ADL, frequent recent falls.        OT Problem List: Decreased strength;Decreased range of motion;Cardiopulmonary status limiting activity;Impaired balance (sitting and/or standing);Decreased knowledge of use of DME or AE      OT Treatment/Interventions: Self-care/ADL training;Therapeutic exercise;Therapeutic activities;DME and/or AE instruction;Patient/family education;Balance training    OT Goals(Current goals can be found in the care plan section) Acute Rehab OT Goals Patient Stated Goal: get better and go home Time For Goal Achievement: 12/22/19 Potential to Achieve Goals: Good ADL Goals Pt Will Perform Lower Body Dressing: with modified independence;sit to/from stand Pt Will Transfer to Toilet: with supervision;ambulating;regular height toilet(LRAD for amb) Additional ADL Goal #1: Pt will verbalize plan to implement at least 2 learned falls prevention strategies to maximize safety/indep with ADL  OT Frequency: Min 1X/week   Barriers to D/C:             Co-evaluation              AM-PAC OT "6 Clicks" Daily Activity     Outcome Measure Help from another person eating meals?: None Help from another person taking care of personal grooming?: None Help from another person toileting, which includes using toliet, bedpan, or urinal?: A Little Help from another person bathing (including washing, rinsing, drying)?: A Little Help from another person to put on and taking off regular upper body clothing?: None Help from another person to put on and taking off regular lower body clothing?: A Little 6 Click Score: 21   End of Session Equipment Utilized During Treatment: Gait belt;Rolling walker;Oxygen Nurse Communication: Other (comment);Mobility status(nurse tech - vitals, mobility status)  Activity Tolerance: Patient tolerated treatment well Patient left: in bed;with call bell/phone within reach;with bed alarm set  OT Visit Diagnosis: Other abnormalities of gait and mobility (R26.89);Repeated falls (R29.6);Muscle weakness (generalized) (M62.81)                Time: 5638-9373 OT Time Calculation (min): 31 min Charges:  OT General Charges $OT Visit: 1 Visit OT Evaluation $OT Eval Low Complexity: 1 Low OT Treatments $Therapeutic Activity: 8-22 mins  Richrd Prime, MPH, MS, OTR/L ascom [phone] 12/08/19,  10:14 AM

## 2019-12-08 NOTE — Progress Notes (Signed)
*  PRELIMINARY RESULTS* Echocardiogram 2D Echocardiogram has been performed.  Sherrie Sport 12/08/2019, 2:28 PM

## 2019-12-08 NOTE — Progress Notes (Signed)
*  PRELIMINARY RESULTS* Echocardiogram 2D Echocardiogram has been performed.  Sherrie Sport 12/08/2019, 2:29 PM

## 2019-12-08 NOTE — Progress Notes (Signed)
Initial Nutrition Assessment  DOCUMENTATION CODES:   Not applicable  INTERVENTION:  -Ensure Enlive po BID, each supplement provides 350 kcal and 20 grams of protein  -MVI with minerals daily   NUTRITION DIAGNOSIS:   Increased nutrient needs related to acute illness(UTI) as evidenced by estimated needs.   GOAL:   Patient will meet greater than or equal to 90% of their needs    MONITOR:   PO intake, I & O's, Labs, Weight trends  REASON FOR ASSESSMENT:   Malnutrition Screening Tool    ASSESSMENT:  RD working remotely.  78 year old male with past medical history significant of HTN and frequent UTIs who presented with complaints of generalized weakness, malaise, fatigue, decreased po intake over the past 2-3 days, and right hip pain secondary to recent fall. Patient admitted for UTI, AKI, and dehydration.  Per chart, SIRS criteria met with elevated WBC, tachycardia, and acute renal failure likely source being urinary. Blood and urine cultures pending.   Patient transferred to floor at 11 pm last night, no recorded meals at this time. Will continue to monitor for po intake and provide Ensure to aid with calorie and protein needs.   I/Os: +1681 ml since admit Current wt 90 kg (198 lbs) +2 BLE edema per review of RN flowsheet No weight history available for review  Medications reviewed and include: Xanax, Lovenox, Rocephin  Labs: K 3.3 (L), BUN 40 (H), Cr 1.38 (H), WBC 18.2 (H),  Hgb 10.8 (L)   NUTRITION - FOCUSED PHYSICAL EXAM: Unable to complete at this time, RD working remotely.   Diet Order:   Diet Order            Diet Heart Room service appropriate? Yes; Fluid consistency: Thin  Diet effective now              EDUCATION NEEDS:   No education needs have been identified at this time  Skin:  Skin Assessment: Reviewed RN Assessment(MASD;groin)  Last BM:  12/23  Height:   Ht Readings from Last 1 Encounters:  12/07/19 5' 9"  (1.753 m)    Weight:    Wt Readings from Last 1 Encounters:  12/07/19 90 kg    Ideal Body Weight:  72.7 kg  BMI:  Body mass index is 29.3 kg/m.  Estimated Nutritional Needs:   Kcal:  1775-1950  Protein:  89-98  Fluid:  >/= 1.7 L/day    Lajuan Lines, RD, LDN Clinical Nutrition [facility] Telephone [phone] After Hours/Weekend Pager: [phone]

## 2019-12-09 LAB — CBC WITH DIFFERENTIAL/PLATELET
Abs Immature Granulocytes: 0.25 10*3/uL — ABNORMAL HIGH (ref 0.00–0.07)
Basophils Absolute: 0.1 10*3/uL (ref 0.0–0.1)
Basophils Relative: 0 %
Eosinophils Absolute: 0 10*3/uL (ref 0.0–0.5)
Eosinophils Relative: 0 %
HCT: 31 % — ABNORMAL LOW (ref 39.0–52.0)
Hemoglobin: 10.5 g/dL — ABNORMAL LOW (ref 13.0–17.0)
Immature Granulocytes: 1 %
Lymphocytes Relative: 5 %
Lymphs Abs: 0.9 10*3/uL (ref 0.7–4.0)
MCH: 29.5 pg (ref 26.0–34.0)
MCHC: 33.9 g/dL (ref 30.0–36.0)
MCV: 87.1 fL (ref 80.0–100.0)
Monocytes Absolute: 1.3 10*3/uL — ABNORMAL HIGH (ref 0.1–1.0)
Monocytes Relative: 7 %
Neutro Abs: 17.4 10*3/uL — ABNORMAL HIGH (ref 1.7–7.7)
Neutrophils Relative %: 87 %
Platelets: 271 10*3/uL (ref 150–400)
RBC: 3.56 MIL/uL — ABNORMAL LOW (ref 4.22–5.81)
RDW: 12.4 % (ref 11.5–15.5)
WBC: 19.9 10*3/uL — ABNORMAL HIGH (ref 4.0–10.5)
nRBC: 0 % (ref 0.0–0.2)

## 2019-12-09 LAB — BASIC METABOLIC PANEL
Anion gap: 10 (ref 5–15)
BUN: 44 mg/dL — ABNORMAL HIGH (ref 8–23)
CO2: 24 mmol/L (ref 22–32)
Calcium: 8.1 mg/dL — ABNORMAL LOW (ref 8.9–10.3)
Chloride: 99 mmol/L (ref 98–111)
Creatinine, Ser: 1.36 mg/dL — ABNORMAL HIGH (ref 0.61–1.24)
GFR calc Af Amer: 57 mL/min — ABNORMAL LOW (ref >60)
GFR calc non Af Amer: 49 mL/min — ABNORMAL LOW (ref >60)
Glucose, Bld: 169 mg/dL — ABNORMAL HIGH (ref 70–99)
Potassium: 3.5 mmol/L (ref 3.5–5.1)
Sodium: 133 mmol/L — ABNORMAL LOW (ref 135–145)

## 2019-12-09 LAB — CK: Total CK: 140 U/L (ref 49–397)

## 2019-12-09 MED ORDER — SODIUM CHLORIDE 0.9 % IV SOLN
1.0000 g | Freq: Three times a day (TID) | INTRAVENOUS | Status: DC
  Administered 2019-12-09 – 2019-12-11 (×7): 1 g via INTRAVENOUS
  Filled 2019-12-09 (×11): qty 1

## 2019-12-09 MED ORDER — SODIUM CHLORIDE 0.9 % IV SOLN: INTRAVENOUS | Status: DC

## 2019-12-09 NOTE — Progress Notes (Addendum)
Pt sleeping between care, but easily aroused. Pt did not eat today. Pt did drink scheduled Ensures and went back to sleep. Pt takes xanax TID. Sent secured text to Dr Kurtis Bushman to check if to hold xanax 1600 dose.  Awaiting response.  Xanax 1600 dose held today per Dr Kurtis Bushman.

## 2019-12-09 NOTE — Consult Note (Signed)
Luke Haynes is a 78 y.o. male  [id number]  Primary Cardiologist: Adrian Blackwater Reason for Consultation: Borderline left ventricular systolic function  HPI: This is 78 year old male who presented to the hospital with UTI and acute kidney injury.  I was asked to evaluate the patient because of borderline left ventricular systolic function.   Review of Systems: Not much history can be obtained to the patient   Past Medical History:  Diagnosis Date  . Hypertension     Medications Prior to Admission  Medication Sig Dispense Refill  . ALPRAZolam (XANAX) 0.5 MG tablet Take 0.5 mg by mouth 3 (three) times daily.    Marland Kitchen amLODipine (NORVASC) 10 MG tablet Take 10 mg by mouth daily.    Marland Kitchen BYSTOLIC 5 MG tablet Take 5 mg by mouth daily.    . enalapril (VASOTEC) 20 MG tablet Take 20 mg by mouth daily.     . furosemide (LASIX) 20 MG tablet Take 20 mg by mouth every morning.    Marland Kitchen HYDROcodone-acetaminophen (NORCO) 7.5-325 MG tablet Take 1 tablet by mouth every 8 (eight) hours as needed.    . tamsulosin (FLOMAX) 0.4 MG CAPS capsule Take 0.4 mg by mouth daily.    . hydrochlorothiazide (HYDRODIURIL) 25 MG tablet Take 12.5 mg by mouth daily.    . meclizine (ANTIVERT) 25 MG tablet Take 25 mg by mouth every 8 (eight) hours as needed for dizziness.       . ALPRAZolam  0.5 mg Oral TID  . amLODipine  5 mg Oral Daily  . enoxaparin (LOVENOX) injection  40 mg Subcutaneous Q24H  . feeding supplement (ENSURE ENLIVE)  237 mL Oral BID BM  . multivitamin with minerals  1 tablet Oral Daily  . nebivolol  5 mg Oral Daily  . tamsulosin  0.4 mg Oral Daily    Infusions: . cefTRIAXone (ROCEPHIN)  IV 2 g (12/08/19 1851)    Allergies  Allergen Reactions  . Sulfa Antibiotics     Social History   Socioeconomic History  . Marital status: Divorced    Spouse name: Not on file  . Number of children: Not on file  . Years of education: Not on file  . Highest education level: Not on file  Occupational History   . Not on file  Tobacco Use  . Smoking status: Never Smoker  . Smokeless tobacco: Never Used  Substance and Sexual Activity  . Alcohol use: Not Currently  . Drug use: Never  . Sexual activity: Not on file  Other Topics Concern  . Not on file  Social History Narrative  . Not on file   Social Determinants of Health   Financial Resource Strain:   . Difficulty of Paying Living Expenses: Not on file  Food Insecurity:   . Worried About Programme researcher, broadcasting/film/video in the Last Year: Not on file  . Ran Out of Food in the Last Year: Not on file  Transportation Needs:   . Lack of Transportation (Medical): Not on file  . Lack of Transportation (Non-Medical): Not on file  Physical Activity:   . Days of Exercise per Week: Not on file  . Minutes of Exercise per Session: Not on file  Stress:   . Feeling of Stress : Not on file  Social Connections:   . Frequency of Communication with Friends and Family: Not on file  . Frequency of Social Gatherings with Friends and Family: Not on file  . Attends Religious Services: Not on file  .  Active Member of Clubs or Organizations: Not on file  . Attends Banker Meetings: Not on file  . Marital Status: Not on file  Intimate Partner Violence:   . Fear of Current or Ex-Partner: Not on file  . Emotionally Abused: Not on file  . Physically Abused: Not on file  . Sexually Abused: Not on file    Family History  Problem Relation Age of Onset  . COPD Father     PHYSICAL EXAM: Vitals:   12/09/19 0802 12/09/19 0940  BP: 134/74 136/76  Pulse: 76 76  Resp: 18   Temp: 98.1 F (36.7 C)   SpO2: 90% 91%     Intake/Output Summary (Last 24 hours) at 12/09/2019 0941 Last data filed at 12/08/2019 2102 Gross per 24 hour  Intake 580 ml  Output 450 ml  Net 130 ml    General:  Well appearing. No respiratory difficulty HEENT: normal Neck: supple. no JVD. Carotids 2+ bilat; no bruits. No lymphadenopathy or thryomegaly appreciated. Cor: PMI  nondisplaced. Regular rate & rhythm. No rubs, gallops or murmurs. Lungs: clear Abdomen: soft, nontender, nondistended. No hepatosplenomegaly. No bruits or masses. Good bowel sounds. Extremities: no cyanosis, clubbing, rash, edema Neuro: alert & oriented x 3, cranial nerves grossly intact. moves all 4 extremities w/o difficulty. Affect pleasant.  ECG: Normal sinus rhythm no acute changes  Results for orders placed or performed during the hospital encounter of 12/07/19 (from the past 24 hour(s))  Basic metabolic panel     Status: Abnormal   Collection Time: 12/09/19  7:35 AM  Result Value Ref Range   Sodium 133 (L) 135 - 145 mmol/L   Potassium 3.5 3.5 - 5.1 mmol/L   Chloride 99 98 - 111 mmol/L   CO2 24 22 - 32 mmol/L   Glucose, Bld 169 (H) 70 - 99 mg/dL   BUN 44 (H) 8 - 23 mg/dL   Creatinine, Ser 2.44 (H) 0.61 - 1.24 mg/dL   Calcium 8.1 (L) 8.9 - 10.3 mg/dL   GFR calc non Af Amer 49 (L) >60 mL/min   GFR calc Af Amer 57 (L) >60 mL/min   Anion gap 10 5 - 15  CBC with Differential/Platelet     Status: Abnormal   Collection Time: 12/09/19  7:35 AM  Result Value Ref Range   WBC 19.9 (H) 4.0 - 10.5 K/uL   RBC 3.56 (L) 4.22 - 5.81 MIL/uL   Hemoglobin 10.5 (L) 13.0 - 17.0 g/dL   HCT 01.0 (L) 27.2 - 53.6 %   MCV 87.1 80.0 - 100.0 fL   MCH 29.5 26.0 - 34.0 pg   MCHC 33.9 30.0 - 36.0 g/dL   RDW 64.4 03.4 - 74.2 %   Platelets 271 150 - 400 K/uL   nRBC 0.0 0.0 - 0.2 %   Neutrophils Relative % 87 %   Neutro Abs 17.4 (H) 1.7 - 7.7 K/uL   Lymphocytes Relative 5 %   Lymphs Abs 0.9 0.7 - 4.0 K/uL   Monocytes Relative 7 %   Monocytes Absolute 1.3 (H) 0.1 - 1.0 K/uL   Eosinophils Relative 0 %   Eosinophils Absolute 0.0 0.0 - 0.5 K/uL   Basophils Relative 0 %   Basophils Absolute 0.1 0.0 - 0.1 K/uL   Immature Granulocytes 1 %   Abs Immature Granulocytes 0.25 (H) 0.00 - 0.07 K/uL  CK     Status: None   Collection Time: 12/09/19  7:42 AM  Result Value Ref Range   Total  CK 140 49 - 397 U/L    DG Chest 1 View  Result Date: 12/07/2019 CLINICAL DATA:  78 year old male with weakness. EXAM: CHEST  1 VIEW COMPARISON:  Chest radiograph dated 11/10/2019 FINDINGS: The lungs are clear. There is no pleural effusion pneumothorax. The cardiac silhouette is within normal limits. Atherosclerotic calcification of the aortic arch. No acute osseous pathology. IMPRESSION: No active cardiopulmonary disease. Electronically Signed   By: Elgie Collard M.D.   On: 12/07/2019 16:30   DG Abd 1 View  Result Date: 12/07/2019 CLINICAL DATA:  Sepsis EXAM: ABDOMEN - 1 VIEW COMPARISON:  None. FINDINGS: No dilated loops of large or small bowel. Gas and stool in the rectum. No pathologic calcifications. No organomegaly. No acute osseous abnormality IMPRESSION: No acute abdominal findings. Electronically Signed   By: Genevive Bi M.D.   On: 12/07/2019 20:19   ECHOCARDIOGRAM COMPLETE  Result Date: 12/08/2019   ECHOCARDIOGRAM REPORT   Patient Name:   Luke Haynes Date of Exam: 12/08/2019 Medical Rec #:  [id number]    Height:       69.0 in Accession #:    [id number]   Weight:       198.4 lb Date of Birth:  06-01-1941     BSA:          2.06 m Patient Age:    78 years     BP:           131/73 mmHg Patient Gender: M            HR:           83 bpm. Exam Location:  [facility] Procedure: 2D Echo, Cardiac Doppler and Color Doppler Indications:     Elevated troponin  History:         Patient has no prior history of Echocardiogram examinations.                  Risk Factors:Hypertension.  Sonographer:     Cristela Blue RDCS (AE) Referring Phys:  Kenn File DOUTOVA Diagnosing Phys: Adrian Blackwater MD  Sonographer Comments: Technically difficult study due to poor echo windows, no parasternal window, no subcostal window and suboptimal apical window. IMPRESSIONS  1. Left ventricular ejection fraction, by visual estimation, is 45 to 50%. The left ventricle has normal function. There is mildly increased left ventricular hypertrophy.  2. Left  ventricular diastolic parameters are consistent with Grade I diastolic dysfunction (impaired relaxation).  3. The left ventricle demonstrates global hypokinesis.  4. Global right ventricle has normal systolic function.The right ventricular size is normal. No increase in right ventricular wall thickness.  5. Left atrial size was normal.  6. Right atrial size was normal.  7. The mitral valve is normal in structure. No evidence of mitral valve regurgitation. No evidence of mitral stenosis.  8. The tricuspid valve is normal in structure.  9. The aortic valve is normal in structure. Aortic valve regurgitation is not visualized. No evidence of aortic valve sclerosis or stenosis. 10. The pulmonic valve was normal in structure. Pulmonic valve regurgitation is not visualized. 11. Normal pulmonary artery systolic pressure. 12. The inferior vena cava is normal in size with greater than 50% respiratory variability, suggesting right atrial pressure of 3 mmHg. FINDINGS  Left Ventricle: Left ventricular ejection fraction, by visual estimation, is 45 to 50%. The left ventricle has normal function. The left ventricle demonstrates global hypokinesis. There is mildly increased left ventricular hypertrophy. Left ventricular diastolic parameters are consistent with Grade I diastolic  dysfunction (impaired relaxation). Normal left atrial pressure. Right Ventricle: The right ventricular size is normal. No increase in right ventricular wall thickness. Global RV systolic function is has normal systolic function. The tricuspid regurgitant velocity is 1.80 m/s, and with an assumed right atrial pressure  of 10 mmHg, the estimated right ventricular systolic pressure is normal at 23.0 mmHg. Left Atrium: Left atrial size was normal in size. Right Atrium: Right atrial size was normal in size Pericardium: There is no evidence of pericardial effusion. Mitral Valve: The mitral valve is normal in structure. No evidence of mitral valve regurgitation. No  evidence of mitral valve stenosis by observation. Tricuspid Valve: The tricuspid valve is normal in structure. Tricuspid valve regurgitation is not demonstrated. Aortic Valve: The aortic valve is normal in structure. Aortic valve regurgitation is not visualized. The aortic valve is structurally normal, with no evidence of sclerosis or stenosis. Aortic valve mean gradient measures 3.5 mmHg. Aortic valve peak gradient measures 6.1 mmHg. Aortic valve area, by VTI measures 2.54 cm. Pulmonic Valve: The pulmonic valve was normal in structure. Pulmonic valve regurgitation is not visualized. Pulmonic regurgitation is not visualized. Aorta: The aortic root, ascending aorta and aortic arch are all structurally normal, with no evidence of dilitation or obstruction. Venous: The inferior vena cava is normal in size with greater than 50% respiratory variability, suggesting right atrial pressure of 3 mmHg. IAS/Shunts: No atrial level shunt detected by color flow Doppler. There is no evidence of a patent foramen ovale. No ventricular septal defect is seen or detected. There is no evidence of an atrial septal defect.  LEFT VENTRICLE PLAX 2D LVIDd:         4.88 cm  Diastology LVIDs:         3.47 cm  LV e' lateral:   7.51 cm/s LV PW:         1.38 cm  LV E/e' lateral: 12.1 LV IVS:        1.11 cm  LV e' medial:    6.09 cm/s LVOT diam:     2.00 cm  LV E/e' medial:  15.0 LV SV:         62 ml LV SV Index:   29.27 LVOT Area:     3.14 cm  RIGHT VENTRICLE RV Basal diam:  4.16 cm RV S prime:     13.40 cm/s TAPSE (M-mode): 3.5 cm LEFT ATRIUM             Index       RIGHT ATRIUM           Index LA diam:        3.50 cm 1.70 cm/m  RA Area:     20.50 cm LA Vol (A2C):   82.1 ml 39.87 ml/m RA Volume:   65.20 ml  31.67 ml/m LA Vol (A4C):   40.3 ml 19.57 ml/m LA Biplane Vol: 57.9 ml 28.12 ml/m  AORTIC VALVE AV Area (Vmax):    2.18 cm AV Area (Vmean):   2.21 cm AV Area (VTI):     2.54 cm AV Vmax:           123.50 cm/s AV Vmean:           87.800 cm/s AV VTI:            0.234 m AV Peak Grad:      6.1 mmHg AV Mean Grad:      3.5 mmHg LVOT Vmax:         85.70  cm/s LVOT Vmean:        61.900 cm/s LVOT VTI:          0.189 m LVOT/AV VTI ratio: 0.81  AORTA Ao Root diam: 2.10 cm MITRAL VALVE                         TRICUSPID VALVE MV Area (PHT): 5.27 cm              TR Peak grad:   13.0 mmHg MV PHT:        41.76 msec            TR Vmax:        186.00 cm/s MV Decel Time: 144 msec MV E velocity: 91.20 cm/s  103 cm/s  SHUNTS MV A velocity: 107.00 cm/s 70.3 cm/s Systemic VTI:  0.19 m MV E/A ratio:  0.85        1.5       Systemic Diam: 2.00 cm  Adrian BlackwaterShaukat Rossana Molchan MD Electronically signed by Adrian BlackwaterShaukat Godric Lavell MD Signature Date/Time: 12/08/2019/5:20:15 PM    Final      ASSESSMENT AND PLAN: LV function is borderline patient appears to be comfortable and asymptomatic.  Patient did come in with uncontrolled hypertension and has mildly elevated creatinine.  Advise restarting small dose of lisinopril at 5 mg p.o. daily.  Hasani Diemer A

## 2019-12-09 NOTE — Progress Notes (Signed)
PROGRESS NOTE    Luke Haynes  KCL:[id number] DOB: Mar 27, 1941 DOA: 12/07/2019 PCP: Albina Billet, MD    Brief Narrative:  Luke Haynes a 78 y.o.malewith medical history significant of HTN, frequent UTIs Presented withInability of ambulatefrequent falls has been laying in bed for the past 2 to 3 days urinating and stooling on himself. At his baseline he used to be able to ambulate Reported generalized weakness malaise fatigue unable to take p.o.'sfor the past 2-3 days.Too weak to get out of bed no fever Family called his primary care provider who recommended for patient to be brought into[facility] to work upsepsis. Patient reported he has had a recent fall after and have had some right hip pain which has diminished his ability to ambulate.  Covid test negative     Consultants:   None  Procedures: Echo  Antimicrobials:   Rocephin    Subjective: Pt has no new complaints. Denies cp, sob, is sleepy but trying to participate with exam.  Objective: Vitals:   12/08/19 2331 12/09/19 0542 12/09/19 0802 12/09/19 0940  BP: 111/64 140/81 134/74 136/76  Pulse: 72 80 76 76  Resp: 18 18 18 17   Temp: 98.1 F (36.7 C) 98.2 F (36.8 C) 98.1 F (36.7 C)   TempSrc: Oral Oral Oral   SpO2: 95% 93% 90% 91%  Weight:      Height:        Intake/Output Summary (Last 24 hours) at 12/09/2019 1046 Last data filed at 12/09/2019 1008 Gross per 24 hour  Intake 580 ml  Output 450 ml  Net 130 ml   Filed Weights   12/07/19 1552  Weight: 90 kg    Examination: General exam: Appears calm and comfortable, NAD, sleepy this am but cooperative. Respiratory system: Clear to auscultation. Respiratory effort normal. Cardiovascular system: S1 & S2 heard, RRR. No murmur/galloops Gastrointestinal system: Abdomen is nondistended, soft and nontender. Normal bowel sounds heard.  No rebound or guarding Central nervous system: Alert and oriented. No focal neurological  deficits. Extremities: No edema Skin: Dry, warm Psychiatry:  Mood & affect appropriate.    Data Reviewed: I have personally reviewed following labs and imaging studies  CBC: Recent Labs  Lab 12/07/19 1630 12/08/19 0528 12/09/19 0735  WBC 21.5* 18.2* 19.9*  NEUTROABS 19.1*  --  17.4*  HGB 12.4* 10.8* 10.5*  HCT 35.3* 31.8* 31.0*  MCV 83.1 87.1 87.1  PLT 284 267 494   Basic Metabolic Panel: Recent Labs  Lab 12/07/19 1630 12/07/19 1930 12/08/19 0528 12/09/19 0735  NA 128*  --  137 133*  K 3.7  --  3.3* 3.5  CL 91*  --  100 99  CO2 23  --  25 24  GLUCOSE 136*  --  138* 169*  BUN 45*  --  40* 44*  CREATININE 1.62*  --  1.38* 1.36*  CALCIUM 8.6*  --  8.0* 8.1*  MG  --  2.2 2.4  --   PHOS  --  2.2* 3.2  --    GFR: Estimated Creatinine Clearance: 49.6 mL/min (A) (by C-G formula based on SCr of 1.36 mg/dL (H)). Liver Function Tests: Recent Labs  Lab 12/07/19 1630 12/08/19 0528  AST 80* 60*  ALT 38 34  ALKPHOS 67 52  BILITOT 1.5* 1.0  PROT 6.8 5.6*  ALBUMIN 2.9* 2.3*   Recent Labs  Lab 12/07/19 1630  LIPASE 16   No results for input(s): AMMONIA in the last 168 hours. Coagulation Profile: No results for  input(s): INR, PROTIME in the last 168 hours. Cardiac Enzymes: Recent Labs  Lab 12/07/19 1930 12/08/19 0528 12/09/19 0742  CKTOTAL 944* 460* 140   BNP (last 3 results) No results for input(s): PROBNP in the last 8760 hours. HbA1C: No results for input(s): HGBA1C in the last 72 hours. CBG: No results for input(s): GLUCAP in the last 168 hours. Lipid Profile: No results for input(s): CHOL, HDL, LDLCALC, TRIG, CHOLHDL, LDLDIRECT in the last 72 hours. Thyroid Function Tests: Recent Labs    12/08/19 0528  TSH 1.287   Anemia Panel: No results for input(s): VITAMINB12, FOLATE, FERRITIN, TIBC, IRON, RETICCTPCT in the last 72 hours. Sepsis Labs: Recent Labs  Lab 12/07/19 1630  PROCALCITON 4.25  LATICACIDVEN 1.5    Recent Results (from the past  240 hour(s))  Blood culture (routine single)     Status: None (Preliminary result)   Collection Time: 12/07/19  4:30 PM   Specimen: BLOOD  Result Value Ref Range Status   Specimen Description BLOOD LEFT ANTECUBITAL  Final   Special Requests   Final    BOTTLES DRAWN AEROBIC AND ANAEROBIC Blood Culture adequate volume   Culture   Final    NO GROWTH 2 DAYS Performed at [facility], [address], [place], [place] [address]    Report Status PENDING  Incomplete  Urine culture     Status: Abnormal (Preliminary result)   Collection Time: 12/07/19  5:07 PM   Specimen: In/Out Cath Urine  Result Value Ref Range Status   Specimen Description   Final    IN/OUT CATH URINE Performed at [facility], [address] [place], [place] [address]    Special Requests   Final    NONE Performed at [facility], [address] [place], [place] [address]    Culture (A)  Final    >=100,000 COLONIES/mL GRAM NEGATIVE RODS IDENTIFICATION AND SUSCEPTIBILITIES TO FOLLOW Performed at [facility], [place] [address] [place], [place] [address]    Report Status PENDING  Incomplete  Blood culture (single)     Status: None (Preliminary result)   Collection Time: 12/07/19  6:22 PM   Specimen: BLOOD  Result Value Ref Range Status   Specimen Description BLOOD BLOOD LEFT ARM  Final   Special Requests   Final    BOTTLES DRAWN AEROBIC AND ANAEROBIC Blood Culture adequate volume   Culture   Final    NO GROWTH 2 DAYS Performed at [facility], [address] [place],  [address]    Report Status PENDING  Incomplete  SARS CORONAVIRUS 2 (TAT 6-24 HRS) Nasopharyngeal Nasopharyngeal Swab     Status: None   Collection Time: 12/07/19  6:22 PM   Specimen: Nasopharyngeal Swab  Result Value Ref Range Status   SARS Coronavirus 2 NEGATIVE NEGATIVE Final    Comment: (NOTE) SARS-CoV-2 target nucleic acids are NOT DETECTED. The SARS-CoV-2 RNA is generally detectable in  upper and lower respiratory specimens during the acute phase of infection. Negative results do not preclude SARS-CoV-2 infection, do not rule out co-infections with other pathogens, and should not be used as the sole basis for treatment or other patient management decisions. Negative results must be combined with clinical observations, patient history, and epidemiological information. The expected result is Negative. Fact Sheet for Patients: SugarRoll.be Fact Sheet for Healthcare Providers: https://www.woods-mathews.com/ This test is not yet approved or cleared by the [place] FDA and  has been authorized for detection and/or diagnosis of SARS-CoV-2  by FDA under an Emergency Use Authorization (EUA). This EUA will remain  in effect (meaning this test can be used) for the duration of the COVID-19 declaration under Section 56 4(b)(1) of the Act, 21 U.S.C. section 360bbb-3(b)(1), unless the authorization is terminated or revoked sooner. Performed at [facility], [place] [address], [place], [place] [address]          Radiology Studies: DG Chest 1 View  Result Date: 12/07/2019 CLINICAL DATA:  78 year old male with weakness. EXAM: CHEST  1 VIEW COMPARISON:  Chest radiograph dated 11/10/2019 FINDINGS: The lungs are clear. There is no pleural effusion pneumothorax. The cardiac silhouette is within normal limits. Atherosclerotic calcification of the aortic arch. No acute osseous pathology. IMPRESSION: No active cardiopulmonary disease. Electronically Signed   By: Anner Crete M.D.   On: 12/07/2019 16:30   DG Abd 1 View  Result Date: 12/07/2019 CLINICAL DATA:  Sepsis EXAM: ABDOMEN - 1 VIEW COMPARISON:  None. FINDINGS: No dilated loops of large or small bowel. Gas and stool in the rectum. No pathologic calcifications. No organomegaly. No acute osseous abnormality IMPRESSION: No acute abdominal findings. Electronically Signed   By: Suzy Bouchard M.D.   On: 12/07/2019 20:19   ECHOCARDIOGRAM COMPLETE  Result Date: 12/08/2019   ECHOCARDIOGRAM REPORT   Patient Name:   BOW BUNTYN Luke Haynes Date of Exam: 12/08/2019 Medical Rec #:  [id number]    Height:       69.0 in Accession #:    [id number]   Weight:       198.4 lb Date of Birth:  11-Dec-1941     BSA:          2.06 m Patient Age:    89 years     BP:           131/73 mmHg Patient Gender: M            HR:           83 bpm. Exam Location:  [facility] Procedure: 2D Echo, Cardiac Doppler and Color Doppler Indications:     Elevated troponin  History:         Patient has no prior history of Echocardiogram examinations.                  Risk Factors:Hypertension.  Sonographer:     Sherrie Sport RDCS (AE) Referring Phys:  [place] Diagnosing Phys: Neoma Laming MD  Sonographer Comments: Technically difficult study due to poor echo windows, no parasternal window, no subcostal window and suboptimal apical window. IMPRESSIONS  1. Left ventricular ejection fraction, by visual estimation, is 45 to 50%. The left ventricle has normal function. There is mildly increased left ventricular hypertrophy.  2. Left ventricular diastolic parameters are consistent with Grade I diastolic dysfunction (impaired relaxation).  3. The left ventricle demonstrates global hypokinesis.  4. Global right ventricle has normal systolic function.The right ventricular size is normal. No increase in right ventricular wall thickness.  5. Left atrial size was normal.  6. Right atrial size was normal.  7. The mitral valve is normal in structure. No evidence of mitral valve regurgitation. No evidence of mitral stenosis.  8. The tricuspid valve is normal in structure.  9. The aortic valve is normal in structure. Aortic valve regurgitation is not visualized. No evidence of aortic valve sclerosis or stenosis. 10. The pulmonic valve was normal in structure. Pulmonic valve regurgitation is not visualized. 11. Normal pulmonary artery systolic pressure. 12.  The inferior  vena cava is normal in size with greater than 50% respiratory variability, suggesting right atrial pressure of 3 mmHg. FINDINGS  Left Ventricle: Left ventricular ejection fraction, by visual estimation, is 45 to 50%. The left ventricle has normal function. The left ventricle demonstrates global hypokinesis. There is mildly increased left ventricular hypertrophy. Left ventricular diastolic parameters are consistent with Grade I diastolic dysfunction (impaired relaxation). Normal left atrial pressure. Right Ventricle: The right ventricular size is normal. No increase in right ventricular wall thickness. Global RV systolic function is has normal systolic function. The tricuspid regurgitant velocity is 1.80 m/s, and with an assumed right atrial pressure  of 10 mmHg, the estimated right ventricular systolic pressure is normal at 23.0 mmHg. Left Atrium: Left atrial size was normal in size. Right Atrium: Right atrial size was normal in size Pericardium: There is no evidence of pericardial effusion. Mitral Valve: The mitral valve is normal in structure. No evidence of mitral valve regurgitation. No evidence of mitral valve stenosis by observation. Tricuspid Valve: The tricuspid valve is normal in structure. Tricuspid valve regurgitation is not demonstrated. Aortic Valve: The aortic valve is normal in structure. Aortic valve regurgitation is not visualized. The aortic valve is structurally normal, with no evidence of sclerosis or stenosis. Aortic valve mean gradient measures 3.5 mmHg. Aortic valve peak gradient measures 6.1 mmHg. Aortic valve area, by VTI measures 2.54 cm. Pulmonic Valve: The pulmonic valve was normal in structure. Pulmonic valve regurgitation is not visualized. Pulmonic regurgitation is not visualized. Aorta: The aortic root, ascending aorta and aortic arch are all structurally normal, with no evidence of dilitation or obstruction. Venous: The inferior vena cava is normal in size with greater  than 50% respiratory variability, suggesting right atrial pressure of 3 mmHg. IAS/Shunts: No atrial level shunt detected by color flow Doppler. There is no evidence of a patent foramen ovale. No ventricular septal defect is seen or detected. There is no evidence of an atrial septal defect.  LEFT VENTRICLE PLAX 2D LVIDd:         4.88 cm  Diastology LVIDs:         3.47 cm  LV e' lateral:   7.51 cm/s LV PW:         1.38 cm  LV E/e' lateral: 12.1 LV IVS:        1.11 cm  LV e' medial:    6.09 cm/s LVOT diam:     2.00 cm  LV E/e' medial:  15.0 LV SV:         62 ml LV SV Index:   29.27 LVOT Area:     3.14 cm  RIGHT VENTRICLE RV Basal diam:  4.16 cm RV S prime:     13.40 cm/s TAPSE (M-mode): 3.5 cm LEFT ATRIUM             Index       RIGHT ATRIUM           Index LA diam:        3.50 cm 1.70 cm/m  RA Area:     20.50 cm LA Vol (A2C):   82.1 ml 39.87 ml/m RA Volume:   65.20 ml  31.67 ml/m LA Vol (A4C):   40.3 ml 19.57 ml/m LA Biplane Vol: 57.9 ml 28.12 ml/m  AORTIC VALVE AV Area (Vmax):    2.18 cm AV Area (Vmean):   2.21 cm AV Area (VTI):     2.54 cm AV Vmax:  123.50 cm/s AV Vmean:          87.800 cm/s AV VTI:            0.234 m AV Peak Grad:      6.1 mmHg AV Mean Grad:      3.5 mmHg LVOT Vmax:         85.70 cm/s LVOT Vmean:        61.900 cm/s LVOT VTI:          0.189 m LVOT/AV VTI ratio: 0.81  AORTA Ao Root diam: 2.10 cm MITRAL VALVE                         TRICUSPID VALVE MV Area (PHT): 5.27 cm              TR Peak grad:   13.0 mmHg MV PHT:        41.76 msec            TR Vmax:        186.00 cm/s MV Decel Time: 144 msec MV E velocity: 91.20 cm/s  103 cm/s  SHUNTS MV A velocity: 107.00 cm/s 70.3 cm/s Systemic VTI:  0.19 m MV E/A ratio:  0.85        1.5       Systemic Diam: 2.00 cm  Neoma Laming MD Electronically signed by Neoma Laming MD Signature Date/Time: 12/08/2019/5:20:15 PM    Final         Scheduled Meds: . ALPRAZolam  0.5 mg Oral TID  . amLODipine  5 mg Oral Daily  . enoxaparin (LOVENOX)  injection  40 mg Subcutaneous Q24H  . feeding supplement (ENSURE ENLIVE)  237 mL Oral BID BM  . multivitamin with minerals  1 tablet Oral Daily  . nebivolol  5 mg Oral Daily  . tamsulosin  0.4 mg Oral Daily   Continuous Infusions: . meropenem (MERREM) IV      Assessment & Plan:   Active Problems:   Acute lower UTI   AKI (acute kidney injury) ([place])   Dehydration   Essential hypertension   Hyponatremia   Debility   Rhabdomyolysis   Elevated troponin   Sepsis ([place])   .Acute lower UTI -- Febrile yesterday afternoon Urine culture with more than 100,000 gram-negative rod organism and sensitivity pending Wbc going back up today We will broaden the spectrum and switch from Rocephin to meropenem  .AKI (acute kidney injury) ([place])-- likely secondary to dehydration,  Restart gentle hydration Monitor volume status. Encouraged po intake Monitor labs HOLD ACE/ARBi and nephrotoxic medications    .Dehydration -- Improving with ivf. Now encourage po intake Per speech-Mech Soft diet for easier mastication(Edentulous status baseline); Thin liquids. General aspiration precautions. Reflux precautions.   .Essential hypertension -hold ACE,  restart Norvasc at 52m po daily Continue Bystolic  hold hydrochlorothiazide  .Hyponatremia -- likely secondary to dehydration, Improved with iv hydration Will resume gentle hydration at 52mhr as na down again and appears more dry today   .Debility-PT/OT  .Rhabdomyolysis-  Ck trended down   .Elevated troponin --no chest pain no EKG changes in the setting ofAKI, rhabdo, dehydration and infectionlikely due to demand ischemia and poor clearance. Echo with EF of 45 to 50%>>>cardiology consulted...> , Recommend lisinopril 5 mg p.o. daily.  Will hold off for now as patient is on the dry side and with AKI, and will restart once more stable. Sepsis --SIRS criteria met with elevated white blood cell count,  tachycardia ,  With evidence of end organ damage such as acute renal failure,  -Most likely source being: urinary F/u cultures Cbc trending down  DVT prophylaxis:Lovenox   Code Status:FUll Family Communication: spoke to [place] updated her about patient's clinical status, all questions and concerns were addressed Disposition Plan: likely d/c in 2 days until . Needs Home OT       LOS: 1 day   Time spent: 45 minutes with more than 50% COC    Nolberto Hanlon, MD [facility] Pager [phone]  If 7PM-7AM, please contact night-coverage [email or web address] Password [facility] 12/09/2019, 10:46 AM

## 2019-12-10 LAB — URINE CULTURE: Culture: >=100000 — AB

## 2019-12-10 LAB — BLOOD CULTURE ID PANEL (REFLEXED)

## 2019-12-10 LAB — CBC
HCT: 33.9 % — ABNORMAL LOW (ref 39.0–52.0)
Hemoglobin: 11.2 g/dL — ABNORMAL LOW (ref 13.0–17.0)
MCH: 29.2 pg (ref 26.0–34.0)
MCHC: 33 g/dL (ref 30.0–36.0)
MCV: 88.3 fL (ref 80.0–100.0)
Platelets: 281 10*3/uL (ref 150–400)
RBC: 3.84 MIL/uL — ABNORMAL LOW (ref 4.22–5.81)
RDW: 12.5 % (ref 11.5–15.5)
WBC: 16.4 10*3/uL — ABNORMAL HIGH (ref 4.0–10.5)
nRBC: 0 % (ref 0.0–0.2)

## 2019-12-10 LAB — BASIC METABOLIC PANEL
Anion gap: 8 (ref 5–15)
BUN: 35 mg/dL — ABNORMAL HIGH (ref 8–23)
CO2: 28 mmol/L (ref 22–32)
Calcium: 8.2 mg/dL — ABNORMAL LOW (ref 8.9–10.3)
Chloride: 101 mmol/L (ref 98–111)
Creatinine, Ser: 1.2 mg/dL (ref 0.61–1.24)
GFR calc Af Amer: >60 mL/min (ref >60)
GFR calc non Af Amer: 58 mL/min — ABNORMAL LOW (ref >60)
Glucose, Bld: 170 mg/dL — ABNORMAL HIGH (ref 70–99)
Potassium: 3.8 mmol/L (ref 3.5–5.1)
Sodium: 137 mmol/L (ref 135–145)

## 2019-12-10 MED ORDER — ALPRAZOLAM 0.5 MG PO TABS
0.5000 mg | ORAL_TABLET | Freq: Three times a day (TID) | ORAL | Status: DC | PRN
  Administered 2019-12-10: 22:00:00 0.5 mg via ORAL
  Filled 2019-12-10: qty 1

## 2019-12-10 MED ORDER — AMLODIPINE BESYLATE 5 MG PO TABS
5.0000 mg | ORAL_TABLET | Freq: Once | ORAL | Status: AC
  Administered 2019-12-10: 14:00:00 5 mg via ORAL
  Filled 2019-12-10: qty 1

## 2019-12-10 MED ORDER — AMLODIPINE BESYLATE 10 MG PO TABS
10.0000 mg | ORAL_TABLET | Freq: Every day | ORAL | Status: DC
  Administered 2019-12-11: 10 mg via ORAL
  Filled 2019-12-10: qty 1

## 2019-12-10 NOTE — Plan of Care (Signed)
  Problem: Education: Goal: Knowledge of General Education information will improve Description: Including pain rating scale, medication(s)/side effects and non-pharmacologic comfort measures Outcome: Progressing   Problem: Health Behavior/Discharge Planning: Goal: Ability to manage health-related needs will improve Outcome: Progressing   Problem: Clinical Measurements: Goal: Ability to maintain clinical measurements within normal limits will improve Outcome: Progressing Goal: Will remain free from infection Outcome: Progressing Goal: Respiratory complications will improve Outcome: Progressing Goal: Cardiovascular complication will be avoided Outcome: Progressing   Problem: Activity: Goal: Risk for activity intolerance will decrease Outcome: Progressing   Problem: Elimination: Goal: Will not experience complications related to urinary retention Outcome: Progressing   Problem: Pain Managment: Goal: General experience of comfort will improve Outcome: Progressing   Problem: Safety: Goal: Ability to remain free from injury will improve Outcome: Progressing   Problem: Skin Integrity: Goal: Risk for impaired skin integrity will decrease Outcome: Progressing

## 2019-12-10 NOTE — Progress Notes (Signed)
PROGRESS NOTE    Luke Haynes  ZPH:[id number] DOB: February 15, 1941 DOA: 12/07/2019 PCP: Albina Billet, MD    Brief Narrative:  Luke Haynes Mayis a 78 y.o.malewith medical history significant of HTN, frequent UTIs Presented withInability of ambulatefrequent falls has been laying in bed for the past 2 to 3 days urinating and stooling on himself. At his baseline he used to be able to ambulate Reported generalized weakness malaise fatigue unable to take p.o.'sfor the past 2-3 days.Too weak to get out of bed no fever Family called his primary care provider who recommended for patient to be brought intoER to work upsepsis. Patient reported he has had a recent fall after and have had some right hip pain which has diminished his ability to ambulate.  Covid test negative     Consultants:   None  Procedures: Echo  Antimicrobials:   Rocephin DC'd on 12/25  Meropenem 12/25 to now    Subjective: Awake. Reports feeling better today.trying to eat more today. Yesterday was sleeping and we did not give evening dose xanax since did not eat much and wanted him to be more awake .   Objective: Vitals:   12/09/19 2347 12/10/19 0352 12/10/19 0747 12/10/19 0906  BP: (!) 148/77 (!) 152/82  (!) 150/78  Pulse: 82 70  82  Resp: (!) 22 20 (!) 28 (!) 32  Temp: 97.8 F (36.6 C) 97.6 F (36.4 C)  97.6 F (36.4 C)  TempSrc: Oral Oral  Oral  SpO2: 96% 97%  98%  Weight:      Height:        Intake/Output Summary (Last 24 hours) at 12/10/2019 1107 Last data filed at 12/10/2019 8016 Gross per 24 hour  Intake 1294.38 ml  Output --  Net 1294.38 ml   Filed Weights   12/07/19 1552  Weight: 90 kg    Examination: General exam: Appears calm and comfortable, NAD, conversating, looks better. Awake more.  Respiratory system: Clear to auscultation. Respiratory effort normal. Cardiovascular system: S1 & S2 heard, RRR. No murmur/galloops Gastrointestinal system: Abdomen is nondistended,  soft and nontender. Normal bowel sounds heard. No guarding Central nervous system: Alert and oriented. No focal neurological deficits. Extremities: No edema Skin: Dry, warm Psychiatry:  Mood & affect appropriate.    Data Reviewed: I have personally reviewed following labs and imaging studies  CBC: Recent Labs  Lab 12/07/19 1630 12/08/19 0528 12/09/19 0735 12/10/19 0755  WBC 21.5* 18.2* 19.9* 16.4*  NEUTROABS 19.1*  --  17.4*  --   HGB 12.4* 10.8* 10.5* 11.2*  HCT 35.3* 31.8* 31.0* 33.9*  MCV 83.1 87.1 87.1 88.3  PLT 284 267 271 553   Basic Metabolic Panel: Recent Labs  Lab 12/07/19 1630 12/07/19 1930 12/08/19 0528 12/09/19 0735 12/10/19 0755  NA 128*  --  137 133* 137  K 3.7  --  3.3* 3.5 3.8  CL 91*  --  100 99 101  CO2 23  --  25 24 28   GLUCOSE 136*  --  138* 169* 170*  BUN 45*  --  40* 44* 35*  CREATININE 1.62*  --  1.38* 1.36* 1.20  CALCIUM 8.6*  --  8.0* 8.1* 8.2*  MG  --  2.2 2.4  --   --   PHOS  --  2.2* 3.2  --   --    GFR: Estimated Creatinine Clearance: 56.3 mL/min (by C-G formula based on SCr of 1.2 mg/dL). Liver Function Tests: Recent Labs  Lab 12/07/19 1630 12/08/19  0528  AST 80* 60*  ALT 38 34  ALKPHOS 67 52  BILITOT 1.5* 1.0  PROT 6.8 5.6*  ALBUMIN 2.9* 2.3*   Recent Labs  Lab 12/07/19 1630  LIPASE 16   No results for input(s): AMMONIA in the last 168 hours. Coagulation Profile: No results for input(s): INR, PROTIME in the last 168 hours. Cardiac Enzymes: Recent Labs  Lab 12/07/19 1930 12/08/19 0528 12/09/19 0742  CKTOTAL 944* 460* 140   BNP (last 3 results) No results for input(s): PROBNP in the last 8760 hours. HbA1C: No results for input(s): HGBA1C in the last 72 hours. CBG: No results for input(s): GLUCAP in the last 168 hours. Lipid Profile: No results for input(s): CHOL, HDL, LDLCALC, TRIG, CHOLHDL, LDLDIRECT in the last 72 hours. Thyroid Function Tests: Recent Labs    12/08/19 0528  TSH 1.287   Anemia  Panel: No results for input(s): VITAMINB12, FOLATE, FERRITIN, TIBC, IRON, RETICCTPCT in the last 72 hours. Sepsis Labs: Recent Labs  Lab 12/07/19 1630  PROCALCITON 4.25  LATICACIDVEN 1.5    Recent Results (from the past 240 hour(s))  Blood culture (routine single)     Status: None (Preliminary result)   Collection Time: 12/07/19  4:30 PM   Specimen: BLOOD  Result Value Ref Range Status   Specimen Description BLOOD LEFT ANTECUBITAL  Final   Special Requests   Final    BOTTLES DRAWN AEROBIC AND ANAEROBIC Blood Culture adequate volume   Culture   Final    NO GROWTH 3 DAYS Performed at [facility], [address], [place], [place] [address]    Report Status PENDING  Incomplete  Urine culture     Status: Abnormal   Collection Time: 12/07/19  5:07 PM   Specimen: In/Out Cath Urine  Result Value Ref Range Status   Specimen Description   Final    IN/OUT CATH URINE Performed at [facility], [address] [place], [place] [address]    Special Requests   Final    NONE Performed at [facility], [address], [place], [place] [address]    Culture >=100,000 COLONIES/mL ESCHERICHIA COLI (A)  Final   Report Status 12/10/2019 FINAL  Final   Organism ID, Bacteria ESCHERICHIA COLI (A)  Final      Susceptibility   Escherichia coli - MIC*    AMPICILLIN >=32 RESISTANT Resistant     CEFAZOLIN <=4 SENSITIVE Sensitive     CEFTRIAXONE <=1 SENSITIVE Sensitive     CIPROFLOXACIN <=0.25 SENSITIVE Sensitive     GENTAMICIN <=1 SENSITIVE Sensitive     IMIPENEM <=0.25 SENSITIVE Sensitive     NITROFURANTOIN <=16 SENSITIVE Sensitive     TRIMETH/SULFA <=20 SENSITIVE Sensitive     AMPICILLIN/SULBACTAM >=32 RESISTANT Resistant     PIP/TAZO <=4 SENSITIVE Sensitive     * >=100,000 COLONIES/mL ESCHERICHIA COLI  Blood culture (single)     Status: None (Preliminary result)   Collection Time: 12/07/19  6:22 PM   Specimen: BLOOD  Result Value Ref Range Status   Specimen  Description BLOOD BLOOD LEFT ARM  Final   Special Requests   Final    BOTTLES DRAWN AEROBIC AND ANAEROBIC Blood Culture adequate volume   Culture  Setup Time   Final    Organism ID to follow GRAM NEGATIVE RODS AEROBIC BOTTLE ONLY CRITICAL RESULT CALLED TO, READ BACK BY AND VERIFIED WITH: SCOTT HALL 12/10/2019 AT 0405 HS Performed at [facility], [address], [place],  [address]  Culture GRAM NEGATIVE RODS  Final   Report Status PENDING  Incomplete  SARS CORONAVIRUS 2 (TAT 6-24 HRS) Nasopharyngeal Nasopharyngeal Swab     Status: None   Collection Time: 12/07/19  6:22 PM   Specimen: Nasopharyngeal Swab  Result Value Ref Range Status   SARS Coronavirus 2 NEGATIVE NEGATIVE Final    Comment: (NOTE) SARS-CoV-2 target nucleic acids are NOT DETECTED. The SARS-CoV-2 RNA is generally detectable in upper and lower respiratory specimens during the acute phase of infection. Negative results do not preclude SARS-CoV-2 infection, do not rule out co-infections with other pathogens, and should not be used as the sole basis for treatment or other patient management decisions. Negative results must be combined with clinical observations, patient history, and epidemiological information. The expected result is Negative. Fact Sheet for Patients: SugarRoll.be Fact Sheet for Healthcare Providers: https://www.woods-mathews.com/ This test is not yet approved or cleared by the [place] FDA and  has been authorized for detection and/or diagnosis of SARS-CoV-2 by FDA under an Emergency Use Authorization (EUA). This EUA will remain  in effect (meaning this test can be used) for the duration of the COVID-19 declaration under Section 56 4(b)(1) of the Act, 21 U.S.C. section 360bbb-3(b)(1), unless the authorization is terminated or revoked sooner. Performed at [facility], [place] [address] [place], [place] [address]   Blood Culture ID  Panel (Reflexed)     Status: Abnormal   Collection Time: 12/07/19  6:22 PM  Result Value Ref Range Status   Enterococcus species NOT DETECTED NOT DETECTED Final   Listeria monocytogenes NOT DETECTED NOT DETECTED Final   Staphylococcus species NOT DETECTED NOT DETECTED Final   Staphylococcus aureus (BCID) NOT DETECTED NOT DETECTED Final   Streptococcus species NOT DETECTED NOT DETECTED Final   Streptococcus agalactiae NOT DETECTED NOT DETECTED Final   Streptococcus pneumoniae NOT DETECTED NOT DETECTED Final   Streptococcus pyogenes NOT DETECTED NOT DETECTED Final   Acinetobacter baumannii NOT DETECTED NOT DETECTED Final   Enterobacteriaceae species DETECTED (A) NOT DETECTED Final    Comment: Enterobacteriaceae represent a large family of gram-negative bacteria, not a single organism. CRITICAL RESULT CALLED TO, READ BACK BY AND VERIFIED WITH: SCOTT HALL 12/10/2019 AT 0405 HS    Enterobacter cloacae complex NOT DETECTED NOT DETECTED Final   Escherichia coli DETECTED (A) NOT DETECTED Final    Comment: CRITICAL RESULT CALLED TO, READ BACK BY AND VERIFIED WITH: SCOTT HALL 12/10/2019 AT 0405 HS    Klebsiella oxytoca NOT DETECTED NOT DETECTED Final   Klebsiella pneumoniae NOT DETECTED NOT DETECTED Final   Proteus species NOT DETECTED NOT DETECTED Final   Serratia marcescens NOT DETECTED NOT DETECTED Final   Carbapenem resistance NOT DETECTED NOT DETECTED Final   Haemophilus influenzae NOT DETECTED NOT DETECTED Final   Neisseria meningitidis NOT DETECTED NOT DETECTED Final   Pseudomonas aeruginosa NOT DETECTED NOT DETECTED Final   Candida albicans NOT DETECTED NOT DETECTED Final   Candida glabrata NOT DETECTED NOT DETECTED Final   Candida krusei NOT DETECTED NOT DETECTED Final   Candida parapsilosis NOT DETECTED NOT DETECTED Final   Candida tropicalis NOT DETECTED NOT DETECTED Final    Comment: Performed at [facility], [address], [place],  [address]          Radiology Studies: ECHOCARDIOGRAM COMPLETE  Result Date: 12/08/2019   ECHOCARDIOGRAM REPORT   Patient Name:   Luke Haynes Meals Date of Exam: 12/08/2019 Medical Rec #:  [id number]    Height:  69.0 in Accession #:    [id number]   Weight:       198.4 lb Date of Birth:  1941/08/12     BSA:          2.06 m Patient Age:    48 years     BP:           131/73 mmHg Patient Gender: M            HR:           83 bpm. Exam Location:  [facility] Procedure: 2D Echo, Cardiac Doppler and Color Doppler Indications:     Elevated troponin  History:         Patient has no prior history of Echocardiogram examinations.                  Risk Factors:Hypertension.  Sonographer:     Sherrie Sport RDCS (AE) Referring Phys:  [place] Diagnosing Phys: Neoma Laming MD  Sonographer Comments: Technically difficult study due to poor echo windows, no parasternal window, no subcostal window and suboptimal apical window. IMPRESSIONS  1. Left ventricular ejection fraction, by visual estimation, is 45 to 50%. The left ventricle has normal function. There is mildly increased left ventricular hypertrophy.  2. Left ventricular diastolic parameters are consistent with Grade I diastolic dysfunction (impaired relaxation).  3. The left ventricle demonstrates global hypokinesis.  4. Global right ventricle has normal systolic function.The right ventricular size is normal. No increase in right ventricular wall thickness.  5. Left atrial size was normal.  6. Right atrial size was normal.  7. The mitral valve is normal in structure. No evidence of mitral valve regurgitation. No evidence of mitral stenosis.  8. The tricuspid valve is normal in structure.  9. The aortic valve is normal in structure. Aortic valve regurgitation is not visualized. No evidence of aortic valve sclerosis or stenosis. 10. The pulmonic valve was normal in structure. Pulmonic valve regurgitation is not visualized. 11. Normal pulmonary artery systolic pressure. 12. The  inferior vena cava is normal in size with greater than 50% respiratory variability, suggesting right atrial pressure of 3 mmHg. FINDINGS  Left Ventricle: Left ventricular ejection fraction, by visual estimation, is 45 to 50%. The left ventricle has normal function. The left ventricle demonstrates global hypokinesis. There is mildly increased left ventricular hypertrophy. Left ventricular diastolic parameters are consistent with Grade I diastolic dysfunction (impaired relaxation). Normal left atrial pressure. Right Ventricle: The right ventricular size is normal. No increase in right ventricular wall thickness. Global RV systolic function is has normal systolic function. The tricuspid regurgitant velocity is 1.80 m/s, and with an assumed right atrial pressure  of 10 mmHg, the estimated right ventricular systolic pressure is normal at 23.0 mmHg. Left Atrium: Left atrial size was normal in size. Right Atrium: Right atrial size was normal in size Pericardium: There is no evidence of pericardial effusion. Mitral Valve: The mitral valve is normal in structure. No evidence of mitral valve regurgitation. No evidence of mitral valve stenosis by observation. Tricuspid Valve: The tricuspid valve is normal in structure. Tricuspid valve regurgitation is not demonstrated. Aortic Valve: The aortic valve is normal in structure. Aortic valve regurgitation is not visualized. The aortic valve is structurally normal, with no evidence of sclerosis or stenosis. Aortic valve mean gradient measures 3.5 mmHg. Aortic valve peak gradient measures 6.1 mmHg. Aortic valve area, by VTI measures 2.54 cm. Pulmonic Valve: The pulmonic valve was normal in structure. Pulmonic valve regurgitation is  not visualized. Pulmonic regurgitation is not visualized. Aorta: The aortic root, ascending aorta and aortic arch are all structurally normal, with no evidence of dilitation or obstruction. Venous: The inferior vena cava is normal in size with greater than  50% respiratory variability, suggesting right atrial pressure of 3 mmHg. IAS/Shunts: No atrial level shunt detected by color flow Doppler. There is no evidence of a patent foramen ovale. No ventricular septal defect is seen or detected. There is no evidence of an atrial septal defect.  LEFT VENTRICLE PLAX 2D LVIDd:         4.88 cm  Diastology LVIDs:         3.47 cm  LV e' lateral:   7.51 cm/s LV PW:         1.38 cm  LV E/e' lateral: 12.1 LV IVS:        1.11 cm  LV e' medial:    6.09 cm/s LVOT diam:     2.00 cm  LV E/e' medial:  15.0 LV SV:         62 ml LV SV Index:   29.27 LVOT Area:     3.14 cm  RIGHT VENTRICLE RV Basal diam:  4.16 cm RV S prime:     13.40 cm/s TAPSE (M-mode): 3.5 cm LEFT ATRIUM             Index       RIGHT ATRIUM           Index LA diam:        3.50 cm 1.70 cm/m  RA Area:     20.50 cm LA Vol (A2C):   82.1 ml 39.87 ml/m RA Volume:   65.20 ml  31.67 ml/m LA Vol (A4C):   40.3 ml 19.57 ml/m LA Biplane Vol: 57.9 ml 28.12 ml/m  AORTIC VALVE AV Area (Vmax):    2.18 cm AV Area (Vmean):   2.21 cm AV Area (VTI):     2.54 cm AV Vmax:           123.50 cm/s AV Vmean:          87.800 cm/s AV VTI:            0.234 m AV Peak Grad:      6.1 mmHg AV Mean Grad:      3.5 mmHg LVOT Vmax:         85.70 cm/s LVOT Vmean:        61.900 cm/s LVOT VTI:          0.189 m LVOT/AV VTI ratio: 0.81  AORTA Ao Root diam: 2.10 cm MITRAL VALVE                         TRICUSPID VALVE MV Area (PHT): 5.27 cm              TR Peak grad:   13.0 mmHg MV PHT:        41.76 msec            TR Vmax:        186.00 cm/s MV Decel Time: 144 msec MV E velocity: 91.20 cm/s  103 cm/s  SHUNTS MV A velocity: 107.00 cm/s 70.3 cm/s Systemic VTI:  0.19 m MV E/A ratio:  0.85        1.5       Systemic Diam: 2.00 cm  Neoma Laming MD Electronically signed by Neoma Laming MD Signature Date/Time: 12/08/2019/5:20:15 PM  Final         Scheduled Meds: . amLODipine  5 mg Oral Daily  . enoxaparin (LOVENOX) injection  40 mg Subcutaneous Q24H   . feeding supplement (ENSURE ENLIVE)  237 mL Oral BID BM  . multivitamin with minerals  1 tablet Oral Daily  . nebivolol  5 mg Oral Daily  . tamsulosin  0.4 mg Oral Daily   Continuous Infusions: . sodium chloride 50 mL/hr at 12/10/19 0959  . meropenem (MERREM) IV 1 g (12/10/19 8921)    Assessment & Plan:   Active Problems:   Acute lower UTI   AKI (acute kidney injury) ([place])   Dehydration   Essential hypertension   Hyponatremia   Debility   Rhabdomyolysis   Elevated troponin   Sepsis ([place])   .Acute lower UTI -- Improving, hemodynamically stable. Wbc trending down slowly.  Blood culture positive for Enterobactercloacae and E. Coli Urine culture positive for E. Coli Was switch from Rocephin to meropenem, will continue for now until blood culture results comes back  .AKI (acute kidney injury) ([place])-- likely secondary to dehydration Improved with gentle hydration Monitor volume status. Encouraged po intake HOLD ACE/ARBi and nephrotoxic medications    .Dehydration -- Improving with ivf.  Now encourage po intake Per speech-Mech Soft diet for easier mastication(Edentulous status baseline); Thin liquids. General aspiration precautions. Reflux precautions.   .Essential hypertension -hold ACE,  Increase Norvasc to 10 mg Continue Bystolic  hold hydrochlorothiazide  .Hyponatremia -- likely secondary to dehydration. Improved with iv hydration Continue to monitor Hold hydrochlorothiazide for now   .Debility-PT/OT-please see note  .Rhabdomyolysis-  Ck trended down   .Elevated troponin --no chest pain no EKG changes in the setting ofAKI, rhabdo, dehydration and infectionlikely due to demand ischemia and poor clearance. Echo with EF of 45 to 50%>>>cardiology consulted...> , Recommend lisinopril 5 mg p.o. daily.  Will hold off for now renal status remains stable  Sepsis --SIRS criteria met with elevated white blood cell count,  tachycardia ,  With evidence of end organ damage such as acute renal failure,  -Most likely source being: urinary. Positive blood culture and urine culture as above Cbc trending down Improving Continue with IV meropenem  DVT prophylaxis:Lovenox   Code Status:FUll Family Communication: spoke to [place] updated her about patient's clinical status, all questions and concerns were addressed Disposition Plan: likely d/c in 1 to 2 days until medically stable. Needs Home OT       LOS: 2 days   Time spent: 45 minutes with more than 50% COC    Nolberto Hanlon, MD [facility] Pager [phone]  If 7PM-7AM, please contact night-coverage [email or web address] Password [facility] 12/10/2019, 11:07 AM Patient ID: Erick Murin Lesniak, male   DOB: 05-22-1941, 78 y.o.   MRN: [id number]

## 2019-12-10 NOTE — Progress Notes (Addendum)
Patient resting between care and he has no complaints of pain. BP and respiratory rate still elevated. Continue to monitor patient.

## 2019-12-10 NOTE — Progress Notes (Signed)
PHARMACY - PHYSICIAN COMMUNICATION CRITICAL VALUE ALERT - BLOOD CULTURE IDENTIFICATION (BCID)  Luke Haynes is an 78 y.o. male who presented to [facility] on 12/07/2019 with a chief complaint of UTI and AKI  Assessment:  Lab reports 1 of 4 bottles + for GNR, E Coli, KPC not detected  Name of physician (or Provider) ContactedRachael Fee NP  Current antibiotics: Meropenem 1gm IV q8hrs  Changes to prescribed antibiotics recommended:  Patient is on recommended antibiotics - No changes needed  Results for orders placed or performed during the hospital encounter of 12/07/19  Blood Culture ID Panel (Reflexed) (Collected: 12/07/2019  6:22 PM)  Result Value Ref Range   Enterococcus species NOT DETECTED NOT DETECTED   Listeria monocytogenes NOT DETECTED NOT DETECTED   Staphylococcus species NOT DETECTED NOT DETECTED   Staphylococcus aureus (BCID) NOT DETECTED NOT DETECTED   Streptococcus species NOT DETECTED NOT DETECTED   Streptococcus agalactiae NOT DETECTED NOT DETECTED   Streptococcus pneumoniae NOT DETECTED NOT DETECTED   Streptococcus pyogenes NOT DETECTED NOT DETECTED   Acinetobacter baumannii NOT DETECTED NOT DETECTED   Enterobacteriaceae species DETECTED (A) NOT DETECTED   Enterobacter cloacae complex NOT DETECTED NOT DETECTED   Escherichia coli DETECTED (A) NOT DETECTED   Klebsiella oxytoca NOT DETECTED NOT DETECTED   Klebsiella pneumoniae NOT DETECTED NOT DETECTED   Proteus species NOT DETECTED NOT DETECTED   Serratia marcescens NOT DETECTED NOT DETECTED   Carbapenem resistance NOT DETECTED NOT DETECTED   Haemophilus influenzae NOT DETECTED NOT DETECTED   Neisseria meningitidis NOT DETECTED NOT DETECTED   Pseudomonas aeruginosa NOT DETECTED NOT DETECTED   Candida albicans NOT DETECTED NOT DETECTED   Candida glabrata NOT DETECTED NOT DETECTED   Candida krusei NOT DETECTED NOT DETECTED   Candida parapsilosis NOT DETECTED NOT DETECTED   Candida tropicalis NOT DETECTED NOT  DETECTED    Hart Robinsons A 12/10/2019  4:08 AM

## 2019-12-11 LAB — CBC
HCT: 33.8 % — ABNORMAL LOW (ref 39.0–52.0)
Hemoglobin: 11 g/dL — ABNORMAL LOW (ref 13.0–17.0)
MCH: 29 pg (ref 26.0–34.0)
MCHC: 32.5 g/dL (ref 30.0–36.0)
MCV: 89.2 fL (ref 80.0–100.0)
Platelets: 330 10*3/uL (ref 150–400)
RBC: 3.79 MIL/uL — ABNORMAL LOW (ref 4.22–5.81)
RDW: 12.4 % (ref 11.5–15.5)
WBC: 14.2 10*3/uL — ABNORMAL HIGH (ref 4.0–10.5)
nRBC: 0 % (ref 0.0–0.2)

## 2019-12-11 MED ORDER — CEPHALEXIN 500 MG PO CAPS: 500.0000 mg | ORAL_CAPSULE | Freq: Four times a day (QID) | ORAL | Status: DC

## 2019-12-11 MED ORDER — LACTINEX PO CHEW: 1.0000 | CHEWABLE_TABLET | Freq: Three times a day (TID) | ORAL | 0 refills | Status: DC

## 2019-12-11 MED ORDER — NEBIVOLOL HCL 10 MG PO TABS: 10.0000 mg | ORAL_TABLET | Freq: Every day | ORAL | 0 refills | Status: DC

## 2019-12-11 MED ORDER — ENALAPRIL MALEATE 5 MG PO TABS: 5.0000 mg | ORAL_TABLET | Freq: Every day | ORAL | 0 refills | Status: DC

## 2019-12-11 MED ORDER — NEBIVOLOL HCL 10 MG PO TABS
10.0000 mg | ORAL_TABLET | Freq: Every day | ORAL | Status: DC
  Administered 2019-12-11: 10 mg via ORAL

## 2019-12-11 MED ORDER — ENSURE ENLIVE PO LIQD: 237.0000 mL | Freq: Two times a day (BID) | ORAL | 12 refills | Status: AC

## 2019-12-11 MED ORDER — CEPHALEXIN 500 MG PO CAPS: 500.0000 mg | ORAL_CAPSULE | Freq: Four times a day (QID) | ORAL | 0 refills | Status: DC

## 2019-12-11 NOTE — Progress Notes (Signed)
[facility] here to transport pt to residence. Discharge packet given to EMS staff with instructions to deliver to family. Discharge home to self/family with [facility] support.

## 2019-12-11 NOTE — Plan of Care (Signed)
Nurse techs gave pt a bath after incontinent stool.  Assessed pt's back and sacral area. Skin intact and sacral dressing placed for prophylaxis.  No other needs were expressed by patient.

## 2019-12-11 NOTE — TOC Initial Note (Addendum)
Transition of Care [facility]) - Initial/Assessment Note    Patient Details  Name: Luke Haynes MRN: [id number] Date of Birth: 07-21-1941  Transition of Care [facility]) CM/SW Contact:    Verna Czech [place], [place] Phone Number: 12/11/2019, 10:44 AM  Clinical Narrative:                 Patient is a 78 year old male 78 y.o. male with medical history significant of HTN, frequent UTIs. Presented with inability to ambulate with frequent falls and has been laying in bed for the past 2 to 3 days urinating and stooling on himself. Patient states that he resides with his daughter in law and her 2 children. Patient states that he is looking forward to returning home and has a 3 in 1 commode      Patient to discharge with [facility] OT and nursing. Per patient, he has not at [facility] services in the past and has no preference. This Child psychotherapist contacted [facility]-could not accept, [facility]-could not accept, [facility]-had wait time of 3-4 days or longer before services could start. [facility] accepted patient, referral completed. This social spoke with patient's daughter in law Irving Burton, states that she has a car that is not working and would like patient to be transferred by non-emergent EMS. Per patient's daughter in law she and her mother will assist with patient's care. Patient's daughter in law confirmed that patient does not have a 3 in 1 commode. Patient has a self made cain but is requesting a walker. Patient to be discharged home by EMS, equipment to be delivered to the home tomorrow as EMS will not transport equipment. Spoke with Nida Boatman, referral completed to have equipment sent to the home.   Penelope Fittro, LCSW Clinical Social Work [phone]   Expected Discharge Plan: Home w Home Health Services Barriers to Discharge: No Barriers Identified   Patient Goals and CMS Choice Patient states their goals for this hospitalization and ongoing recovery are:: "I want to get better, get back up and going" CMS Medicare.gov  Compare Post Acute Care list provided to:: Patient Choice offered to / list presented to : Patient  Expected Discharge Plan and Services Expected Discharge Plan: Home w Home Health Services In-house Referral: Clinical Social Work   Post Acute Care Choice: Home Health Living arrangements for the past 2 months: Single Family Home                           HH Arranged: PT          Prior Living Arrangements/Services Living arrangements for the past 2 months: Single Family Home Lives with:: Other (Comment)(Lives with daughter in law) Patient language and need for interpreter reviewed:: Yes Do you feel safe going back to the place where you live?: Yes      Need for Family Participation in Patient Care: Yes (Comment) Care giver support system in place?: Yes (comment) Current home services: Home OT, Home PT Criminal Activity/Legal Involvement Pertinent to Current Situation/Hospitalization: No - Comment as needed  Activities of Daily Living Home Assistive Devices/Equipment: None ADL Screening (condition at time of admission) Patient's cognitive ability adequate to safely complete daily activities?: Yes Is the patient deaf or have difficulty hearing?: No Does the patient have difficulty seeing, even when wearing glasses/contacts?: No Does the patient have difficulty concentrating, remembering, or making decisions?: No Patient able to express need for assistance with ADLs?: No Does the patient have difficulty dressing or  bathing?: No Independently performs ADLs?: Yes (appropriate for developmental age) Does the patient have difficulty walking or climbing stairs?: No Weakness of Legs: Both Weakness of Arms/Hands: None  Permission Sought/Granted   Permission granted to share information with : Yes, Verbal Permission Granted  Share Information with NAME: Raquel Sarna     Permission granted to share info w Relationship: daughter in law     Emotional Assessment Appearance:: Appears  older than stated age Attitude/Demeanor/Rapport: Engaged Affect (typically observed): Accepting Orientation: : Oriented to Self, Oriented to Place, Oriented to  Time, Oriented to Situation Alcohol / Substance Use: Other (comment) Psych Involvement: No (comment)  Admission diagnosis:  AKI (acute kidney injury) ([place]) [N17.9] Cystitis [N30.90] Sepsis ([place]) [A41.9] Sepsis with acute renal failure without septic shock, due to unspecified organism, unspecified acute renal failure type ([place]) [A41.9, R65.20, N17.9] Patient Active Problem List   Diagnosis Date Noted  . Acute lower UTI 12/07/2019  . AKI (acute kidney injury) ([place]) 12/07/2019  . Dehydration 12/07/2019  . Essential hypertension 12/07/2019  . Hyponatremia 12/07/2019  . Debility 12/07/2019  . Rhabdomyolysis 12/07/2019  . Elevated troponin 12/07/2019  . Sepsis ([place]) 12/07/2019   PCP:  Albina Billet, MD Pharmacy:   [id number] - , [place] - [address] [place] [address] Phone: [phone] Fax: [phone]     Social Determinants of Health (SDOH) Interventions    Readmission Risk Interventions No flowsheet data found.

## 2019-12-11 NOTE — Discharge Summary (Addendum)
Luke Haynes Route TMH:[id number] DOB: 1941/08/30 DOA: 12/07/2019  PCP: Albina Billet, MD  Admit date: 12/07/2019 Discharge date: 12/11/2019  Admitted From: Home Disposition: Home  Recommendations for Outpatient Follow-up:  1. Follow up with PCP in 1 week 2. Please obtain BMP/CBC in one week 3. Please follow up on the following pending results: Blood cultures and urine cultures 4. Cardiology Dr. Yancey Flemings in 1 week  Home Health: OT/PT   Discharge Condition:Stable CODE STATUS: Full Diet recommendation: Heart Healthy-  Mech Soft diet for easier mastication(Edentulous status baseline); Thin liquids. General aspiration precautions.   Brief/Interim Summary:  Luke Haynes a 78 y.o.malewith medical history significant of HTN, frequent UTIs Presented withInability of ambulatefrequent falls has been laying in bed for the past 2 to 3 days urinating and stooling on himself. At his baseline he used to be able to ambulate. Reported generalized weakness malaise fatigue unable to take p.o.'sfor the past 2-3 days.Too weak to get out of bed no fever Family called his primary care provider who recommended for patient to be brought intoER to work upsepsis..  Covid test negative his urine culture was positive for more than 100,000 E. coli and blood culture also positive for E. coli Enterobacteriaceae.  He was started on IV meropenem and on discharge switched to cephalexin p.o.. Blood culture sensitivity still pending.  Because speech swallow evaluation while in hospital and also PT OT.  OT recommended home health OT with 3-1 bedside commode.  Once he was afebrile WBCs were going down and he was mentating he was stable to be discharged home.  With acute kidney injury likely from dehydration and infection.  His ASRs and hydrochlorothiazide and Lasix were all held.  With IV hydration and encourage p.o. intake his creatinine improved to baseline.  He also had mildly hyponatremia due to dehydration which improved  with IV hydration.  Was found with mild rhabdomyolysis from his recent fall which improved again with hydration and had elevated troponin which cardiology was consulted for.  Her echocardiogram revealing EF of 45 to 50%.  He was euvolemic and asymptomatic.  Cardiology recommended mended just follow-up as outpatient.  He is ACE inhibitor was resumed at 5 mg upon discharge and beta-blocker increased to 10 mg bisoprolol.  He is mentating well and he is stable to be discharged home after being treated for sepsis with bacteremia  due to UTI. Discharge Diagnoses:  Active Problems:   Acute lower UTI   AKI (acute kidney injury) ([place])   Dehydration   Essential hypertension   Hyponatremia   Debility   Rhabdomyolysis   Elevated troponin   Sepsis [facility])    Discharge Instructions  Discharge Instructions    Call MD for:  temperature >100.4   Complete by: As directed    Diet - low sodium heart healthy   Complete by: As directed    Discharge instructions   Complete by: As directed    F/u with pcp in one week F/u with Dr. Ernestina Columbia cardiology in one week Take Xanax as as needed , not as regular base   Increase activity slowly   Complete by: As directed      Allergies as of 12/11/2019      Reactions   Sulfa Antibiotics       Medication List    STOP taking these medications   furosemide 20 MG tablet Commonly known as: LASIX   hydrochlorothiazide 25 MG tablet Commonly known as: HYDRODIURIL   meclizine 25 MG tablet Commonly known  as: ANTIVERT     TAKE these medications   ALPRAZolam 0.5 MG tablet Commonly known as: XANAX Take 0.5 mg by mouth 3 (three) times daily.   amLODipine 10 MG tablet Commonly known as: NORVASC Take 10 mg by mouth daily.   cephALEXin 500 MG capsule Commonly known as: KEFLEX Take 1 capsule (500 mg total) by mouth every 6 (six) hours.   enalapril 5 MG tablet Commonly known as: VASOTEC Take 1 tablet (5 mg total) by mouth daily. What changed:    medication strength  how much to take   feeding supplement (ENSURE ENLIVE) Liqd Take 237 mLs by mouth 2 (two) times daily between meals.   HYDROcodone-acetaminophen 7.5-325 MG tablet Commonly known as: NORCO Take 1 tablet by mouth every 8 (eight) hours as needed.   lactobacillus acidophilus & bulgar chewable tablet Chew 1 tablet by mouth 3 (three) times daily with meals.   nebivolol 10 MG tablet Commonly known as: BYSTOLIC Take 1 tablet (10 mg total) by mouth daily. Start taking on: December 12, 2019 What changed:   medication strength  how much to take   tamsulosin 0.4 MG Caps capsule Commonly known as: FLOMAX Take 0.4 mg by mouth daily.            Durable Medical Equipment  (From admission, onward)         Start     Ordered   12/11/19 1050  DME 3-in-1  Once     12/11/19 1049         Follow-up Information    Adrian Blackwater A, MD Follow up in 1 week(s).   Specialty: Cardiology Contact information: [address] [place] [address] [phone]        Jaclyn Shaggy, MD Follow up in 1 week(s).   Specialty: Internal Medicine Why: needs bmp, f/u cultures Contact information: [address]   [place] [address] [phone]          Allergies  Allergen Reactions  . Sulfa Antibiotics     Consultations:  Cardiology   Procedures/Studies: DG Chest 1 View  Result Date: 12/07/2019 CLINICAL DATA:  78 year old male with weakness. EXAM: CHEST  1 VIEW COMPARISON:  Chest radiograph dated 11/10/2019 FINDINGS: The lungs are clear. There is no pleural effusion pneumothorax. The cardiac silhouette is within normal limits. Atherosclerotic calcification of the aortic arch. No acute osseous pathology. IMPRESSION: No active cardiopulmonary disease. Electronically Signed   By: Elgie Collard M.D.   On: 12/07/2019 16:30   DG Abd 1 View  Result Date: 12/07/2019 CLINICAL DATA:  Sepsis EXAM: ABDOMEN - 1 VIEW COMPARISON:  None. FINDINGS: No  dilated loops of large or small bowel. Gas and stool in the rectum. No pathologic calcifications. No organomegaly. No acute osseous abnormality IMPRESSION: No acute abdominal findings. Electronically Signed   By: Genevive Bi M.D.   On: 12/07/2019 20:19   ECHOCARDIOGRAM COMPLETE  Result Date: 12/08/2019   ECHOCARDIOGRAM REPORT   Patient Name:   QUSAI KEM Buchheit Date of Exam: 12/08/2019 Medical Rec #:  [id number]    Height:       69.0 in Accession #:    [id number]   Weight:       198.4 lb Date of Birth:  February 17, 1941     BSA:          2.06 m Patient Age:    78 years     BP:           131/73 mmHg Patient  Gender: M            HR:           83 bpm. Exam Location:  [facility] Procedure: 2D Echo, Cardiac Doppler and Color Doppler Indications:     Elevated troponin  History:         Patient has no prior history of Echocardiogram examinations.                  Risk Factors:Hypertension.  Sonographer:     Cristela Blue RDCS (AE) Referring Phys:  Kenn File DOUTOVA Diagnosing Phys: Adrian Blackwater MD  Sonographer Comments: Technically difficult study due to poor echo windows, no parasternal window, no subcostal window and suboptimal apical window. IMPRESSIONS  1. Left ventricular ejection fraction, by visual estimation, is 45 to 50%. The left ventricle has normal function. There is mildly increased left ventricular hypertrophy.  2. Left ventricular diastolic parameters are consistent with Grade I diastolic dysfunction (impaired relaxation).  3. The left ventricle demonstrates global hypokinesis.  4. Global right ventricle has normal systolic function.The right ventricular size is normal. No increase in right ventricular wall thickness.  5. Left atrial size was normal.  6. Right atrial size was normal.  7. The mitral valve is normal in structure. No evidence of mitral valve regurgitation. No evidence of mitral stenosis.  8. The tricuspid valve is normal in structure.  9. The aortic valve is normal in structure. Aortic valve  regurgitation is not visualized. No evidence of aortic valve sclerosis or stenosis. 10. The pulmonic valve was normal in structure. Pulmonic valve regurgitation is not visualized. 11. Normal pulmonary artery systolic pressure. 12. The inferior vena cava is normal in size with greater than 50% respiratory variability, suggesting right atrial pressure of 3 mmHg. FINDINGS  Left Ventricle: Left ventricular ejection fraction, by visual estimation, is 45 to 50%. The left ventricle has normal function. The left ventricle demonstrates global hypokinesis. There is mildly increased left ventricular hypertrophy. Left ventricular diastolic parameters are consistent with Grade I diastolic dysfunction (impaired relaxation). Normal left atrial pressure. Right Ventricle: The right ventricular size is normal. No increase in right ventricular wall thickness. Global RV systolic function is has normal systolic function. The tricuspid regurgitant velocity is 1.80 m/s, and with an assumed right atrial pressure  of 10 mmHg, the estimated right ventricular systolic pressure is normal at 23.0 mmHg. Left Atrium: Left atrial size was normal in size. Right Atrium: Right atrial size was normal in size Pericardium: There is no evidence of pericardial effusion. Mitral Valve: The mitral valve is normal in structure. No evidence of mitral valve regurgitation. No evidence of mitral valve stenosis by observation. Tricuspid Valve: The tricuspid valve is normal in structure. Tricuspid valve regurgitation is not demonstrated. Aortic Valve: The aortic valve is normal in structure. Aortic valve regurgitation is not visualized. The aortic valve is structurally normal, with no evidence of sclerosis or stenosis. Aortic valve mean gradient measures 3.5 mmHg. Aortic valve peak gradient measures 6.1 mmHg. Aortic valve area, by VTI measures 2.54 cm. Pulmonic Valve: The pulmonic valve was normal in structure. Pulmonic valve regurgitation is not visualized.  Pulmonic regurgitation is not visualized. Aorta: The aortic root, ascending aorta and aortic arch are all structurally normal, with no evidence of dilitation or obstruction. Venous: The inferior vena cava is normal in size with greater than 50% respiratory variability, suggesting right atrial pressure of 3 mmHg. IAS/Shunts: No atrial level shunt detected by color flow Doppler. There is no evidence of  a patent foramen ovale. No ventricular septal defect is seen or detected. There is no evidence of an atrial septal defect.  LEFT VENTRICLE PLAX 2D LVIDd:         4.88 cm  Diastology LVIDs:         3.47 cm  LV e' lateral:   7.51 cm/s LV PW:         1.38 cm  LV E/e' lateral: 12.1 LV IVS:        1.11 cm  LV e' medial:    6.09 cm/s LVOT diam:     2.00 cm  LV E/e' medial:  15.0 LV SV:         62 ml LV SV Index:   29.27 LVOT Area:     3.14 cm  RIGHT VENTRICLE RV Basal diam:  4.16 cm RV S prime:     13.40 cm/s TAPSE (M-mode): 3.5 cm LEFT ATRIUM             Index       RIGHT ATRIUM           Index LA diam:        3.50 cm 1.70 cm/m  RA Area:     20.50 cm LA Vol (A2C):   82.1 ml 39.87 ml/m RA Volume:   65.20 ml  31.67 ml/m LA Vol (A4C):   40.3 ml 19.57 ml/m LA Biplane Vol: 57.9 ml 28.12 ml/m  AORTIC VALVE AV Area (Vmax):    2.18 cm AV Area (Vmean):   2.21 cm AV Area (VTI):     2.54 cm AV Vmax:           123.50 cm/s AV Vmean:          87.800 cm/s AV VTI:            0.234 m AV Peak Grad:      6.1 mmHg AV Mean Grad:      3.5 mmHg LVOT Vmax:         85.70 cm/s LVOT Vmean:        61.900 cm/s LVOT VTI:          0.189 m LVOT/AV VTI ratio: 0.81  AORTA Ao Root diam: 2.10 cm MITRAL VALVE                         TRICUSPID VALVE MV Area (PHT): 5.27 cm              TR Peak grad:   13.0 mmHg MV PHT:        41.76 msec            TR Vmax:        186.00 cm/s MV Decel Time: 144 msec MV E velocity: 91.20 cm/s  103 cm/s  SHUNTS MV A velocity: 107.00 cm/s 70.3 cm/s Systemic VTI:  0.19 m MV E/A ratio:  0.85        1.5       Systemic Diam:  2.00 cm  Adrian Blackwater MD Electronically signed by Adrian Blackwater MD Signature Date/Time: 12/08/2019/5:20:15 PM    Final        Subjective:  Patient seen and examined.  He has no chest pain, shortness of breath or any other symptoms.  Overall feeling good.  Had a bowel movement. Discharge Exam: Vitals:   12/11/19 0428 12/11/19 0802  BP: (!) 155/84 (!) 148/85  Pulse: 81 79  Resp: 16 17  Temp: 98.1 F (  36.7 C) 98 F (36.7 C)  SpO2: 94% 94%   Vitals:   12/10/19 2023 12/10/19 2334 12/11/19 0428 12/11/19 0802  BP: (!) 159/75 (!) 157/80 (!) 155/84 (!) 148/85  Pulse: 84 85 81 79  Resp: 18 16 16 17   Temp: 97.7 F (36.5 C) (!) 97.4 F (36.3 C) 98.1 F (36.7 C) 98 F (36.7 C)  TempSrc: Oral Oral Oral Oral  SpO2: 96% 94% 94% 94%  Weight:      Height:        General: Pt is alert, awake x3, not in acute distress Cardiovascular: RRR, S1/S2 +, no rubs, no gallops Respiratory: CTA bilaterally, no wheezing, no rhonchi Abdominal: Soft, NT, ND, bowel sounds + Extremities: no edema, no cyanosis Neuro exam no focal deficits noted    The results of significant diagnostics from this hospitalization (including imaging, microbiology, ancillary and laboratory) are listed below for reference.     Microbiology: Recent Results (from the past 240 hour(s))  Blood culture (routine single)     Status: None (Preliminary result)   Collection Time: 12/07/19  4:30 PM   Specimen: BLOOD  Result Value Ref Range Status   Specimen Description BLOOD LEFT ANTECUBITAL  Final   Special Requests   Final    BOTTLES DRAWN AEROBIC AND ANAEROBIC Blood Culture adequate volume   Culture   Final    NO GROWTH 4 DAYS Performed at [facility], [address] [place], [place] [address]    Report Status PENDING  Incomplete  Urine culture     Status: Abnormal   Collection Time: 12/07/19  5:07 PM   Specimen: In/Out Cath Urine  Result Value Ref Range Status   Specimen Description   Final    IN/OUT CATH  URINE Performed at [facility], [address] [place], [place] [address]    Special Requests   Final    NONE Performed at [facility], [address] [place], [place] [address]    Culture >=100,000 COLONIES/mL ESCHERICHIA COLI (A)  Final   Report Status 12/10/2019 FINAL  Final   Organism ID, Bacteria ESCHERICHIA COLI (A)  Final      Susceptibility   Escherichia coli - MIC*    AMPICILLIN >=32 RESISTANT Resistant     CEFAZOLIN <=4 SENSITIVE Sensitive     CEFTRIAXONE <=1 SENSITIVE Sensitive     CIPROFLOXACIN <=0.25 SENSITIVE Sensitive     GENTAMICIN <=1 SENSITIVE Sensitive     IMIPENEM <=0.25 SENSITIVE Sensitive     NITROFURANTOIN <=16 SENSITIVE Sensitive     TRIMETH/SULFA <=20 SENSITIVE Sensitive     AMPICILLIN/SULBACTAM >=32 RESISTANT Resistant     PIP/TAZO <=4 SENSITIVE Sensitive     * >=100,000 COLONIES/mL ESCHERICHIA COLI  Blood culture (single)     Status: Abnormal (Preliminary result)   Collection Time: 12/07/19  6:22 PM   Specimen: BLOOD  Result Value Ref Range Status   Specimen Description   Final    BLOOD BLOOD LEFT ARM Performed at [facility], [address] [place], [place] [address]    Special Requests   Final    BOTTLES DRAWN AEROBIC AND ANAEROBIC Blood Culture adequate volume Performed at [facility], [address] [place], [place] [address]    Culture  Setup Time   Final    GRAM NEGATIVE RODS AEROBIC BOTTLE ONLY CRITICAL RESULT CALLED TO, READ BACK BY AND VERIFIED WITH: SCOTT HALL 12/10/2019 AT 0405 HS    Culture (A)  Final  ESCHERICHIA COLI SUSCEPTIBILITIES TO FOLLOW Performed at [facility], [address] [place], [place] [address]    Report Status PENDING  Incomplete  SARS CORONAVIRUS 2 (TAT 6-24 HRS) Nasopharyngeal Nasopharyngeal Swab     Status: None   Collection Time: 12/07/19  6:22 PM   Specimen: Nasopharyngeal Swab  Result Value Ref Range Status   SARS Coronavirus 2 NEGATIVE NEGATIVE  Final    Comment: (NOTE) SARS-CoV-2 target nucleic acids are NOT DETECTED. The SARS-CoV-2 RNA is generally detectable in upper and lower respiratory specimens during the acute phase of infection. Negative results do not preclude SARS-CoV-2 infection, do not rule out co-infections with other pathogens, and should not be used as the sole basis for treatment or other patient management decisions. Negative results must be combined with clinical observations, patient history, and epidemiological information. The expected result is Negative. Fact Sheet for Patients: HairSlick.no Fact Sheet for Healthcare Providers: quierodirigir.com This test is not yet approved or cleared by the [place] FDA and  has been authorized for detection and/or diagnosis of SARS-CoV-2 by FDA under an Emergency Use Authorization (EUA). This EUA will remain  in effect (meaning this test can be used) for the duration of the COVID-19 declaration under Section 56 4(b)(1) of the Act, 21 U.S.C. section 360bbb-3(b)(1), unless the authorization is terminated or revoked sooner. Performed at [facility], [address] [place], [place] [address]   Blood Culture ID Panel (Reflexed)     Status: Abnormal   Collection Time: 12/07/19  6:22 PM  Result Value Ref Range Status   Enterococcus species NOT DETECTED NOT DETECTED Final   Listeria monocytogenes NOT DETECTED NOT DETECTED Final   Staphylococcus species NOT DETECTED NOT DETECTED Final   Staphylococcus aureus (BCID) NOT DETECTED NOT DETECTED Final   Streptococcus species NOT DETECTED NOT DETECTED Final   Streptococcus agalactiae NOT DETECTED NOT DETECTED Final   Streptococcus pneumoniae NOT DETECTED NOT DETECTED Final   Streptococcus pyogenes NOT DETECTED NOT DETECTED Final   Acinetobacter baumannii NOT DETECTED NOT DETECTED Final   Enterobacteriaceae species DETECTED (A) NOT DETECTED Final    Comment:  Enterobacteriaceae represent a large family of gram-negative bacteria, not a single organism. CRITICAL RESULT CALLED TO, READ BACK BY AND VERIFIED WITH: SCOTT HALL 12/10/2019 AT 0405 HS    Enterobacter cloacae complex NOT DETECTED NOT DETECTED Final   Escherichia coli DETECTED (A) NOT DETECTED Final    Comment: CRITICAL RESULT CALLED TO, READ BACK BY AND VERIFIED WITH: SCOTT HALL 12/10/2019 AT 0405 HS    Klebsiella oxytoca NOT DETECTED NOT DETECTED Final   Klebsiella pneumoniae NOT DETECTED NOT DETECTED Final   Proteus species NOT DETECTED NOT DETECTED Final   Serratia marcescens NOT DETECTED NOT DETECTED Final   Carbapenem resistance NOT DETECTED NOT DETECTED Final   Haemophilus influenzae NOT DETECTED NOT DETECTED Final   Neisseria meningitidis NOT DETECTED NOT DETECTED Final   Pseudomonas aeruginosa NOT DETECTED NOT DETECTED Final   Candida albicans NOT DETECTED NOT DETECTED Final   Candida glabrata NOT DETECTED NOT DETECTED Final   Candida krusei NOT DETECTED NOT DETECTED Final   Candida parapsilosis NOT DETECTED NOT DETECTED Final   Candida tropicalis NOT DETECTED NOT DETECTED Final    Comment: Performed at [facility], [address] [place], [place] [address]     Labs: BNP (last 3 results) Recent Labs    11/09/19 1901 12/07/19 1638  BNP 113.0* 346.0*   Basic Metabolic Panel: Recent Labs  Lab  12/07/19 1630 12/07/19 1930 12/08/19 0528 12/09/19 0735 12/10/19 0755  NA 128*  --  137 133* 137  K 3.7  --  3.3* 3.5 3.8  CL 91*  --  100 99 101  CO2 23  --  25 24 28   GLUCOSE 136*  --  138* 169* 170*  BUN 45*  --  40* 44* 35*  CREATININE 1.62*  --  1.38* 1.36* 1.20  CALCIUM 8.6*  --  8.0* 8.1* 8.2*  MG  --  2.2 2.4  --   --   PHOS  --  2.2* 3.2  --   --    Liver Function Tests: Recent Labs  Lab 12/07/19 1630 12/08/19 0528  AST 80* 60*  ALT 38 34  ALKPHOS 67 52  BILITOT 1.5* 1.0  PROT 6.8 5.6*  ALBUMIN 2.9* 2.3*   Recent Labs  Lab 12/07/19 1630   LIPASE 16   No results for input(s): AMMONIA in the last 168 hours. CBC: Recent Labs  Lab 12/07/19 1630 12/08/19 0528 12/09/19 0735 12/10/19 0755 12/11/19 0524  WBC 21.5* 18.2* 19.9* 16.4* 14.2*  NEUTROABS 19.1*  --  17.4*  --   --   HGB 12.4* 10.8* 10.5* 11.2* 11.0*  HCT 35.3* 31.8* 31.0* 33.9* 33.8*  MCV 83.1 87.1 87.1 88.3 89.2  PLT 284 267 271 281 330   Cardiac Enzymes: Recent Labs  Lab 12/07/19 1930 12/08/19 0528 12/09/19 0742  CKTOTAL 944* 460* 140   BNP: Invalid input(s): POCBNP CBG: No results for input(s): GLUCAP in the last 168 hours. D-Dimer No results for input(s): DDIMER in the last 72 hours. Hgb A1c No results for input(s): HGBA1C in the last 72 hours. Lipid Profile No results for input(s): CHOL, HDL, LDLCALC, TRIG, CHOLHDL, LDLDIRECT in the last 72 hours. Thyroid function studies No results for input(s): TSH, T4TOTAL, T3FREE, THYROIDAB in the last 72 hours.  Invalid input(s): FREET3 Anemia work up No results for input(s): VITAMINB12, FOLATE, FERRITIN, TIBC, IRON, RETICCTPCT in the last 72 hours. Urinalysis    Component Value Date/Time   COLORURINE AMBER (A) 12/07/2019 1707   APPEARANCEUR CLOUDY (A) 12/07/2019 1707   LABSPEC 1.017 12/07/2019 1707   PHURINE 5.0 12/07/2019 1707   GLUCOSEU NEGATIVE 12/07/2019 1707   HGBUR LARGE (A) 12/07/2019 1707   BILIRUBINUR NEGATIVE 12/07/2019 1707   KETONESUR NEGATIVE 12/07/2019 1707   PROTEINUR 100 (A) 12/07/2019 1707   NITRITE NEGATIVE 12/07/2019 1707   LEUKOCYTESUR MODERATE (A) 12/07/2019 1707   Sepsis Labs Invalid input(s): PROCALCITONIN,  WBC,  LACTICIDVEN Microbiology Recent Results (from the past 240 hour(s))  Blood culture (routine single)     Status: None (Preliminary result)   Collection Time: 12/07/19  4:30 PM   Specimen: BLOOD  Result Value Ref Range Status   Specimen Description BLOOD LEFT ANTECUBITAL  Final   Special Requests   Final    BOTTLES DRAWN AEROBIC AND ANAEROBIC Blood Culture  adequate volume   Culture   Final    NO GROWTH 4 DAYS Performed at [facility], [address], [place], [place] [address]    Report Status PENDING  Incomplete  Urine culture     Status: Abnormal   Collection Time: 12/07/19  5:07 PM   Specimen: In/Out Cath Urine  Result Value Ref Range Status   Specimen Description   Final    IN/OUT CATH URINE Performed at [facility], [address] [place], [place] [address]    Special Requests   Final  NONE Performed at [facility], [address] [place], [place] [address]    Culture >=100,000 COLONIES/mL ESCHERICHIA COLI (A)  Final   Report Status 12/10/2019 FINAL  Final   Organism ID, Bacteria ESCHERICHIA COLI (A)  Final      Susceptibility   Escherichia coli - MIC*    AMPICILLIN >=32 RESISTANT Resistant     CEFAZOLIN <=4 SENSITIVE Sensitive     CEFTRIAXONE <=1 SENSITIVE Sensitive     CIPROFLOXACIN <=0.25 SENSITIVE Sensitive     GENTAMICIN <=1 SENSITIVE Sensitive     IMIPENEM <=0.25 SENSITIVE Sensitive     NITROFURANTOIN <=16 SENSITIVE Sensitive     TRIMETH/SULFA <=20 SENSITIVE Sensitive     AMPICILLIN/SULBACTAM >=32 RESISTANT Resistant     PIP/TAZO <=4 SENSITIVE Sensitive     * >=100,000 COLONIES/mL ESCHERICHIA COLI  Blood culture (single)     Status: Abnormal (Preliminary result)   Collection Time: 12/07/19  6:22 PM   Specimen: BLOOD  Result Value Ref Range Status   Specimen Description   Final    BLOOD BLOOD LEFT ARM Performed at [facility], [address], [place], [place] [address]    Special Requests   Final    BOTTLES DRAWN AEROBIC AND ANAEROBIC Blood Culture adequate volume Performed at [facility], [address] [place], [place] [address]    Culture  Setup Time   Final    GRAM NEGATIVE RODS AEROBIC BOTTLE ONLY CRITICAL RESULT CALLED TO, READ BACK BY AND VERIFIED WITH: SCOTT HALL 12/10/2019 AT 0405 HS    Culture (A)  Final    ESCHERICHIA  COLI SUSCEPTIBILITIES TO FOLLOW Performed at [facility], [address], [place], [place] [address]    Report Status PENDING  Incomplete  SARS CORONAVIRUS 2 (TAT 6-24 HRS) Nasopharyngeal Nasopharyngeal Swab     Status: None   Collection Time: 12/07/19  6:22 PM   Specimen: Nasopharyngeal Swab  Result Value Ref Range Status   SARS Coronavirus 2 NEGATIVE NEGATIVE Final    Comment: (NOTE) SARS-CoV-2 target nucleic acids are NOT DETECTED. The SARS-CoV-2 RNA is generally detectable in upper and lower respiratory specimens during the acute phase of infection. Negative results do not preclude SARS-CoV-2 infection, do not rule out co-infections with other pathogens, and should not be used as the sole basis for treatment or other patient management decisions. Negative results must be combined with clinical observations, patient history, and epidemiological information. The expected result is Negative. Fact Sheet for Patients: HairSlick.no Fact Sheet for Healthcare Providers: quierodirigir.com This test is not yet approved or cleared by the [place] FDA and  has been authorized for detection and/or diagnosis of SARS-CoV-2 by FDA under an Emergency Use Authorization (EUA). This EUA will remain  in effect (meaning this test can be used) for the duration of the COVID-19 declaration under Section 56 4(b)(1) of the Act, 21 U.S.C. section 360bbb-3(b)(1), unless the authorization is terminated or revoked sooner. Performed at [facility], [address] [place], [place] [address]   Blood Culture ID Panel (Reflexed)     Status: Abnormal   Collection Time: 12/07/19  6:22 PM  Result Value Ref Range Status   Enterococcus species NOT DETECTED NOT DETECTED Final   Listeria monocytogenes NOT DETECTED NOT DETECTED Final   Staphylococcus species NOT DETECTED NOT DETECTED Final   Staphylococcus aureus (BCID) NOT DETECTED NOT  DETECTED Final   Streptococcus species NOT DETECTED NOT DETECTED Final   Streptococcus agalactiae NOT DETECTED NOT  DETECTED Final   Streptococcus pneumoniae NOT DETECTED NOT DETECTED Final   Streptococcus pyogenes NOT DETECTED NOT DETECTED Final   Acinetobacter baumannii NOT DETECTED NOT DETECTED Final   Enterobacteriaceae species DETECTED (A) NOT DETECTED Final    Comment: Enterobacteriaceae represent a large family of gram-negative bacteria, not a single organism. CRITICAL RESULT CALLED TO, READ BACK BY AND VERIFIED WITH: SCOTT HALL 12/10/2019 AT 0405 HS    Enterobacter cloacae complex NOT DETECTED NOT DETECTED Final   Escherichia coli DETECTED (A) NOT DETECTED Final    Comment: CRITICAL RESULT CALLED TO, READ BACK BY AND VERIFIED WITH: SCOTT HALL 12/10/2019 AT 0405 HS    Klebsiella oxytoca NOT DETECTED NOT DETECTED Final   Klebsiella pneumoniae NOT DETECTED NOT DETECTED Final   Proteus species NOT DETECTED NOT DETECTED Final   Serratia marcescens NOT DETECTED NOT DETECTED Final   Carbapenem resistance NOT DETECTED NOT DETECTED Final   Haemophilus influenzae NOT DETECTED NOT DETECTED Final   Neisseria meningitidis NOT DETECTED NOT DETECTED Final   Pseudomonas aeruginosa NOT DETECTED NOT DETECTED Final   Candida albicans NOT DETECTED NOT DETECTED Final   Candida glabrata NOT DETECTED NOT DETECTED Final   Candida krusei NOT DETECTED NOT DETECTED Final   Candida parapsilosis NOT DETECTED NOT DETECTED Final   Candida tropicalis NOT DETECTED NOT DETECTED Final    Comment: Performed at [facility], [address], [place], [place] [address]     Time coordinating discharge: Over 30 minutes  SIGNED:   Lynn Ito, MD  [facility] 12/11/2019, 10:51 AM Pager   If 7PM-7AM, please contact night-coverage [email or web address] Password [email or web address]

## 2019-12-11 NOTE — Progress Notes (Signed)
IV antibiotic administered prior to discharge per MD order. TC and spoke with  Stanton Kidney, primary cg, with oral AVS instructions given and advised prescriptions need to be picked up and started this evening with stated understanding. Stanton Kidney also advised that DME is scheduled to be delivered tomorrow and confirmed cg would be available to accept pt. Written AVS instructions prepared and placed in discharge packet to go with EMS when pt discharged. Pt updated and agreeable with plan to discharge home with [facility] services. Pt prepared for transport.

## 2019-12-12 LAB — CULTURE, BLOOD (SINGLE): Special Requests: ADEQUATE

## 2019-12-14 ENCOUNTER — Emergency Department: Payer: Medicare HMO

## 2019-12-14 ENCOUNTER — Other Ambulatory Visit: Payer: Self-pay

## 2019-12-14 ENCOUNTER — Inpatient Hospital Stay
Admission: EM | Admit: 2019-12-14 | Discharge: 2019-12-19 | DRG: 872 | Disposition: A | Discharge status: Skilled Nursing Facility | Payer: Medicare HMO | Attending: Internal Medicine | Admitting: Internal Medicine

## 2019-12-14 DIAGNOSIS — I119 Hypertensive heart disease without heart failure: Secondary | ICD-10-CM | POA: Diagnosis present

## 2019-12-14 DIAGNOSIS — Z20822 Contact with and (suspected) exposure to covid-19: Secondary | ICD-10-CM | POA: Diagnosis present

## 2019-12-14 DIAGNOSIS — Z87440 Personal history of urinary (tract) infections: Secondary | ICD-10-CM | POA: Diagnosis not present

## 2019-12-14 DIAGNOSIS — D649 Anemia, unspecified: Secondary | ICD-10-CM | POA: Diagnosis present

## 2019-12-14 DIAGNOSIS — M25572 Pain in left ankle and joints of left foot: Secondary | ICD-10-CM | POA: Diagnosis present

## 2019-12-14 DIAGNOSIS — N390 Urinary tract infection, site not specified: Secondary | ICD-10-CM | POA: Diagnosis present

## 2019-12-14 DIAGNOSIS — R531 Weakness: Secondary | ICD-10-CM | POA: Diagnosis not present

## 2019-12-14 DIAGNOSIS — A419 Sepsis, unspecified organism: Secondary | ICD-10-CM | POA: Diagnosis present

## 2019-12-14 DIAGNOSIS — B9620 Unspecified Escherichia coli [E. coli] as the cause of diseases classified elsewhere: Secondary | ICD-10-CM | POA: Diagnosis present

## 2019-12-14 DIAGNOSIS — R7881 Bacteremia: Secondary | ICD-10-CM | POA: Diagnosis not present

## 2019-12-14 DIAGNOSIS — I10 Essential (primary) hypertension: Secondary | ICD-10-CM | POA: Diagnosis not present

## 2019-12-14 DIAGNOSIS — Z882 Allergy status to sulfonamides status: Secondary | ICD-10-CM | POA: Diagnosis not present

## 2019-12-14 DIAGNOSIS — M25571 Pain in right ankle and joints of right foot: Secondary | ICD-10-CM | POA: Diagnosis present

## 2019-12-14 DIAGNOSIS — N3090 Cystitis, unspecified without hematuria: Secondary | ICD-10-CM | POA: Diagnosis present

## 2019-12-14 DIAGNOSIS — Z79899 Other long term (current) drug therapy: Secondary | ICD-10-CM

## 2019-12-14 DIAGNOSIS — M25579 Pain in unspecified ankle and joints of unspecified foot: Secondary | ICD-10-CM

## 2019-12-14 DIAGNOSIS — G8929 Other chronic pain: Secondary | ICD-10-CM | POA: Diagnosis not present

## 2019-12-14 LAB — CULTURE, BLOOD (SINGLE): Special Requests: ADEQUATE

## 2019-12-14 LAB — COMPREHENSIVE METABOLIC PANEL
ALT: 42 U/L (ref 0–44)
AST: 44 U/L — ABNORMAL HIGH (ref 15–41)
Albumin: 3 g/dL — ABNORMAL LOW (ref 3.5–5.0)
Alkaline Phosphatase: 177 U/L — ABNORMAL HIGH (ref 38–126)
Anion gap: 12 (ref 5–15)
BUN: 17 mg/dL (ref 8–23)
CO2: 26 mmol/L (ref 22–32)
Calcium: 8.8 mg/dL — ABNORMAL LOW (ref 8.9–10.3)
Chloride: 96 mmol/L — ABNORMAL LOW (ref 98–111)
Creatinine, Ser: 0.99 mg/dL (ref 0.61–1.24)
GFR calc Af Amer: >60 mL/min (ref >60)
GFR calc non Af Amer: >60 mL/min (ref >60)
Glucose, Bld: 193 mg/dL — ABNORMAL HIGH (ref 70–99)
Potassium: 3.9 mmol/L (ref 3.5–5.1)
Sodium: 134 mmol/L — ABNORMAL LOW (ref 135–145)
Total Bilirubin: 1 mg/dL (ref 0.3–1.2)
Total Protein: 7.7 g/dL (ref 6.5–8.1)

## 2019-12-14 LAB — CBC WITH DIFFERENTIAL/PLATELET
Abs Immature Granulocytes: 0.99 10*3/uL — ABNORMAL HIGH (ref 0.00–0.07)
Basophils Absolute: 0.1 10*3/uL (ref 0.0–0.1)
Basophils Relative: 1 %
Eosinophils Absolute: 0.1 10*3/uL (ref 0.0–0.5)
Eosinophils Relative: 1 %
HCT: 40.2 % (ref 39.0–52.0)
Hemoglobin: 13.2 g/dL (ref 13.0–17.0)
Immature Granulocytes: 5 %
Lymphocytes Relative: 7 %
Lymphs Abs: 1.5 10*3/uL (ref 0.7–4.0)
MCH: 29.3 pg (ref 26.0–34.0)
MCHC: 32.8 g/dL (ref 30.0–36.0)
MCV: 89.1 fL (ref 80.0–100.0)
Monocytes Absolute: 1.3 10*3/uL — ABNORMAL HIGH (ref 0.1–1.0)
Monocytes Relative: 6 %
Neutro Abs: 16.9 10*3/uL — ABNORMAL HIGH (ref 1.7–7.7)
Neutrophils Relative %: 80 %
Platelets: 599 10*3/uL — ABNORMAL HIGH (ref 150–400)
RBC: 4.51 MIL/uL (ref 4.22–5.81)
RDW: 12.5 % (ref 11.5–15.5)
WBC: 20.9 10*3/uL — ABNORMAL HIGH (ref 4.0–10.5)
nRBC: 0 % (ref 0.0–0.2)

## 2019-12-14 LAB — URINALYSIS, ROUTINE W REFLEX MICROSCOPIC
Bacteria, UA: NONE SEEN
Bilirubin Urine: NEGATIVE
Glucose, UA: >=500 mg/dL — AB
Ketones, ur: NEGATIVE mg/dL
Nitrite: NEGATIVE
Protein, ur: 30 mg/dL — AB
Specific Gravity, Urine: 1.007 (ref 1.005–1.030)
Squamous Epithelial / HPF: NONE SEEN (ref 0–5)
pH: 6 (ref 5.0–8.0)

## 2019-12-14 LAB — APTT: aPTT: 27 seconds (ref 24–36)

## 2019-12-14 LAB — RESPIRATORY PANEL BY RT PCR (FLU A&B, COVID)
Influenza A by PCR: NEGATIVE
Influenza B by PCR: NEGATIVE
SARS Coronavirus 2 by RT PCR: NEGATIVE

## 2019-12-14 LAB — PROTIME-INR
INR: 0.9 (ref 0.8–1.2)
Prothrombin Time: 12.2 seconds (ref 11.4–15.2)

## 2019-12-14 LAB — LACTIC ACID, PLASMA: Lactic Acid, Venous: 1.9 mmol/L (ref 0.5–1.9)

## 2019-12-14 MED ORDER — VANCOMYCIN HCL IN DEXTROSE 1-5 GM/200ML-% IV SOLN
1000.0000 mg | Freq: Once | INTRAVENOUS | Status: AC
  Administered 2019-12-14: 1000 mg via INTRAVENOUS
  Filled 2019-12-14: qty 200

## 2019-12-14 MED ORDER — TAMSULOSIN HCL 0.4 MG PO CAPS
0.4000 mg | ORAL_CAPSULE | Freq: Every day | ORAL | Status: DC
  Administered 2019-12-14 – 2019-12-19 (×6): 0.4 mg via ORAL
  Filled 2019-12-14 (×6): qty 1

## 2019-12-14 MED ORDER — SODIUM CHLORIDE 0.9 % IV SOLN: INTRAVENOUS | Status: DC

## 2019-12-14 MED ORDER — ACETAMINOPHEN 650 MG RE SUPP: 650.0000 mg | Freq: Four times a day (QID) | RECTAL | Status: DC | PRN

## 2019-12-14 MED ORDER — TRAZODONE HCL 50 MG PO TABS
50.0000 mg | ORAL_TABLET | Freq: Every evening | ORAL | Status: DC | PRN
  Filled 2019-12-14: qty 1

## 2019-12-14 MED ORDER — PIPERACILLIN-TAZOBACTAM 3.375 G IVPB 30 MIN
3.3750 g | Freq: Once | INTRAVENOUS | Status: AC
  Administered 2019-12-14: 3.375 g via INTRAVENOUS
  Filled 2019-12-14: qty 50

## 2019-12-14 MED ORDER — SODIUM CHLORIDE 0.9 % IV SOLN: 2.0000 g | Freq: Once | INTRAVENOUS | Status: DC

## 2019-12-14 MED ORDER — NEBIVOLOL HCL 10 MG PO TABS
10.0000 mg | ORAL_TABLET | Freq: Every day | ORAL | Status: DC
  Administered 2019-12-15 – 2019-12-18 (×4): 10 mg via ORAL
  Filled 2019-12-14 (×6): qty 1

## 2019-12-14 MED ORDER — THIAMINE HCL 100 MG PO TABS
100.0000 mg | ORAL_TABLET | Freq: Every day | ORAL | Status: DC
  Administered 2019-12-14 – 2019-12-19 (×6): 100 mg via ORAL
  Filled 2019-12-14 (×6): qty 1

## 2019-12-14 MED ORDER — ENOXAPARIN SODIUM 40 MG/0.4ML ~~LOC~~ SOLN
40.0000 mg | SUBCUTANEOUS | Status: DC
  Administered 2019-12-14 – 2019-12-17 (×4): 40 mg via SUBCUTANEOUS
  Filled 2019-12-14 (×4): qty 0.4

## 2019-12-14 MED ORDER — SENNOSIDES-DOCUSATE SODIUM 8.6-50 MG PO TABS
1.0000 | ORAL_TABLET | Freq: Every evening | ORAL | Status: DC | PRN
  Filled 2019-12-14: qty 1

## 2019-12-14 MED ORDER — SODIUM CHLORIDE 0.9 % IV BOLUS
500.0000 mL | Freq: Once | INTRAVENOUS | Status: AC
  Administered 2019-12-14: 500 mL via INTRAVENOUS

## 2019-12-14 MED ORDER — ENSURE ENLIVE PO LIQD
237.0000 mL | Freq: Two times a day (BID) | ORAL | Status: DC
  Administered 2019-12-15 – 2019-12-19 (×9): 237 mL via ORAL

## 2019-12-14 MED ORDER — ENALAPRIL MALEATE 5 MG PO TABS
5.0000 mg | ORAL_TABLET | Freq: Every day | ORAL | Status: DC
  Administered 2019-12-14 – 2019-12-19 (×6): 5 mg via ORAL
  Filled 2019-12-14 (×6): qty 1

## 2019-12-14 MED ORDER — ACETAMINOPHEN 325 MG PO TABS: 650.0000 mg | ORAL_TABLET | Freq: Four times a day (QID) | ORAL | Status: DC | PRN

## 2019-12-14 MED ORDER — SODIUM CHLORIDE 0.9 % IV SOLN
2.0000 g | Freq: Three times a day (TID) | INTRAVENOUS | Status: DC
  Administered 2019-12-14 – 2019-12-16 (×6): 2 g via INTRAVENOUS
  Filled 2019-12-14 (×11): qty 2

## 2019-12-14 MED ORDER — FOLIC ACID 1 MG PO TABS
1.0000 mg | ORAL_TABLET | Freq: Every day | ORAL | Status: DC
  Administered 2019-12-14 – 2019-12-19 (×6): 1 mg via ORAL
  Filled 2019-12-14 (×6): qty 1

## 2019-12-14 MED ORDER — HYDROCODONE-ACETAMINOPHEN 5-325 MG PO TABS: 1.0000 | ORAL_TABLET | Freq: Three times a day (TID) | ORAL | Status: DC | PRN

## 2019-12-14 MED ORDER — ALPRAZOLAM 0.5 MG PO TABS
0.5000 mg | ORAL_TABLET | Freq: Three times a day (TID) | ORAL | Status: DC
  Administered 2019-12-14 – 2019-12-19 (×13): 0.5 mg via ORAL
  Filled 2019-12-14 (×14): qty 1

## 2019-12-14 MED ORDER — AMLODIPINE BESYLATE 10 MG PO TABS
10.0000 mg | ORAL_TABLET | Freq: Every day | ORAL | Status: DC
  Administered 2019-12-15 – 2019-12-19 (×5): 10 mg via ORAL
  Filled 2019-12-14 (×5): qty 1
  Filled 2019-12-14: qty 2

## 2019-12-14 MED ORDER — ADULT MULTIVITAMIN W/MINERALS CH
1.0000 | ORAL_TABLET | Freq: Every day | ORAL | Status: DC
  Administered 2019-12-14 – 2019-12-19 (×6): 1 via ORAL
  Filled 2019-12-14 (×6): qty 1

## 2019-12-14 NOTE — Progress Notes (Signed)
Patient being transferred to 159. Report called to RN.

## 2019-12-14 NOTE — ED Notes (Signed)
Patient transported to CT 

## 2019-12-14 NOTE — ED Notes (Signed)
Pt resting in NAD. Denies further needs.

## 2019-12-14 NOTE — ED Notes (Signed)
Pt remains resting. Pt laid back to rest. No further needs.

## 2019-12-14 NOTE — ED Notes (Signed)
Spoke to family who lives with patient. Family states that she would like to see him go to short term facility for PT or OT because pt has been laying in bed for 5 days and it took 3 EMS to get pt out of bed today. Caregiver is also currently using walker and has CHF. She is responsible for taking care of him and she has been struggling since he got so weak.

## 2019-12-14 NOTE — ED Triage Notes (Signed)
Pt comes EMS from home after UTI dx a few days ago. Pt AOx4. Pt has been taking antibiotics as normal but doesn't feel right. Pt states his feet have been swelling and he cannot get around like her used it. Pt fever was 101 with EMS and 975mg  tylenol was given with EMS. Pt temp read 98.2 now but pt just had some water.

## 2019-12-14 NOTE — ED Notes (Signed)
Pt given cold water. TV turned on. Lights turned off.

## 2019-12-14 NOTE — ED Provider Notes (Signed)
[facility] Emergency Department Provider Note       Time seen: ----------------------------------------- 12:57 PM on 12/14/2019 -----------------------------------------   I have reviewed the triage vital signs and the nursing notes.  HISTORY   Chief Complaint No chief complaint on file.    HPI Luke Haynes is a 78 y.o. male with a history of hypertension, UTI, AKI, dehydration, general debility, rhabdomyolysis who presents to the ED for general ill feeling.  Patient arrives by EMS from home after recently being treated for UTI several days ago.  He states his feet have been swelling and he cannot get around like he used to.  He had a fever of 101 with EMS and they gave him oral Tylenol.  Past Medical History:  Diagnosis Date  . Hypertension     Patient Active Problem List   Diagnosis Date Noted  . Acute lower UTI 12/07/2019  . AKI (acute kidney injury) (HCC) 12/07/2019  . Dehydration 12/07/2019  . Essential hypertension 12/07/2019  . Hyponatremia 12/07/2019  . Debility 12/07/2019  . Rhabdomyolysis 12/07/2019  . Elevated troponin 12/07/2019  . Sepsis (HCC) 12/07/2019    No past surgical history on file.  Allergies Sulfa antibiotics  Social History Social History   Tobacco Use  . Smoking status: Never Smoker  . Smokeless tobacco: Never Used  Substance Use Topics  . Alcohol use: Not Currently  . Drug use: Never   Review of Systems Constitutional: Positive for fever Cardiovascular: Negative for chest pain. Respiratory: Negative for shortness of breath. Gastrointestinal: Negative for abdominal pain, vomiting and diarrhea. Musculoskeletal: Negative for back pain. Skin: Negative for rash. Neurological: Positive for weakness  All systems negative/normal/unremarkable except as stated in the HPI  ____________________________________________   PHYSICAL EXAM:  VITAL SIGNS: ED Triage Vitals  Enc Vitals Group     BP      Pulse     Resp      Temp      Temp src      SpO2      Weight      Height      Head Circumference      Peak Flow      Pain Score      Pain Loc      Pain Edu?      Excl. in GC?    Constitutional: Alert and oriented.  No distress Eyes: Conjunctivae are normal. Normal extraocular movements. ENT      Head: Normocephalic and atraumatic.      Nose: No congestion/rhinnorhea.      Mouth/Throat: Mucous membranes are moist.      Neck: No stridor. Cardiovascular: Normal rate, regular rhythm. No murmurs, rubs, or gallops. Respiratory: Normal respiratory effort without tachypnea nor retractions. Breath sounds are clear and equal bilaterally. No wheezes/rales/rhonchi. Gastrointestinal: Soft and nontender. Normal bowel sounds Musculoskeletal: Nontender with normal range of motion in extremities.  Mild edema Neurologic:  Normal speech and language. No gross focal neurologic deficits are appreciated.  Skin:  Skin is warm, dry and intact. No rash noted. Psychiatric: Mood and affect are normal. Speech and behavior are normal.  ____________________________________________  EKG: Interpreted by me.  Sinus rhythm with rate of 85 bpm, PVC, leftward axis, normal QT  ____________________________________________  ED COURSE:  As part of my medical decision making, I reviewed the following data within the electronic MEDICAL RECORD NUMBER History obtained from family if available, nursing notes, old chart and ekg, as well as notes from prior ED visits. Patient  presented for fever and weakness, we will assess with labs and imaging as indicated at this time.   Procedures  Luke Haynes was evaluated in Emergency Department on 12/14/2019 for the symptoms described in the history of present illness. He was evaluated in the context of the global COVID-19 pandemic, which necessitated consideration that the patient might be at risk for infection with the SARS-CoV-2 virus that causes COVID-19. Institutional protocols and  algorithms that pertain to the evaluation of patients at risk for COVID-19 are in a state of rapid change based on information released by regulatory bodies including the CDC and federal and state organizations. These policies and algorithms were followed during the patient's care in the ED.  ____________________________________________   LABS (pertinent positives/negatives)  Labs Reviewed  COMPREHENSIVE METABOLIC PANEL - Abnormal; Notable for the following components:      Result Value   Sodium 134 (*)    Chloride 96 (*)    Glucose, Bld 193 (*)    Calcium 8.8 (*)    Albumin 3.0 (*)    AST 44 (*)    Alkaline Phosphatase 177 (*)    All other components within normal limits  CBC WITH DIFFERENTIAL/PLATELET - Abnormal; Notable for the following components:   WBC 20.9 (*)    Platelets 599 (*)    Neutro Abs 16.9 (*)    Monocytes Absolute 1.3 (*)    Abs Immature Granulocytes 0.99 (*)    All other components within normal limits  URINALYSIS, ROUTINE W REFLEX MICROSCOPIC - Abnormal; Notable for the following components:   Color, Urine YELLOW (*)    APPearance CLEAR (*)    Glucose, UA >=500 (*)    Hgb urine dipstick MODERATE (*)    Protein, ur 30 (*)    Leukocytes,Ua TRACE (*)    All other components within normal limits  CULTURE, BLOOD (ROUTINE X 2)  CULTURE, BLOOD (ROUTINE X 2)  URINE CULTURE  RESPIRATORY PANEL BY RT PCR (FLU A&B, COVID)  LACTIC ACID, PLASMA  APTT  PROTIME-INR    RADIOLOGY Images were viewed by me  Chest x-ray  IMPRESSION:  Negative chest.  ____________________________________________   DIFFERENTIAL DIAGNOSIS   Dehydration, electrolyte abnormality, sepsis, pneumonia, COVID-19, MI, CVA  FINAL ASSESSMENT AND PLAN  Fever, weakness   Plan: The patient had presented for weakness. Patient's labs did indicate significant leukocytosis which is worsened since his previous hospitalization. Patient's imaging has not revealed any acute process on chest x-ray.   CT renal protocol is been ordered to ensure he does not have an infected kidney stone.  Anticipate admission.   Laurence Aly, MD    Note: This note was generated in part or whole with voice recognition software. Voice recognition is usually quite accurate but there are transcription errors that can and very often do occur. I apologize for any typographical errors that were not detected and corrected.     Earleen Newport, MD 12/14/19 [phone]

## 2019-12-14 NOTE — Progress Notes (Signed)
Pharmacy Antibiotic Note  Luke Haynes is a 78 y.o. male admitted on 12/14/2019 with UTI/sepsis  Pharmacy has been consulted for Cefepime dosing.  Recent admission 12/23-12/27/2020 for UTI/bacteremia was on Meropenem then cephalexin  Plan: Patient received Zosyn x 1 and Vancomycin x 1 dose in ER. Will order Cefepime 2 gm IV q8h for UTI w/ sepsis. F/u cultures and renal fxn   Height: 5\' 9"  (175.3 cm) Weight: 198 lb 6.6 oz (90 kg) IBW/kg (Calculated) : 70.7  Temp (24hrs), Avg:98.2 F (36.8 C), Min:98.2 F (36.8 C), Max:98.2 F (36.8 C)  Recent Labs  Lab 12/08/19 0528 12/09/19 0735 12/10/19 0755 12/11/19 0524 12/14/19 1301  WBC 18.2* 19.9* 16.4* 14.2* 20.9*  CREATININE 1.38* 1.36* 1.20  --  0.99  LATICACIDVEN  --   --   --   --  1.9    Estimated Creatinine Clearance: 68.2 mL/min (by C-G formula based on SCr of 0.99 mg/dL).    Allergies  Allergen Reactions  . Sulfa Antibiotics     Antimicrobials this admission: Vanc 12/30 x 1 Zosyn 12/30  x1 Cefepime 12/30 >>  Dose adjustments this admission:    Microbiology results: 12/30 BCx: pending 12/30 UCx: pend    Sputum:      MRSA PCR:    Thank you for allowing pharmacy to be a part of this patient's care.  Trannie Bardales A 12/14/2019 4:45 PM

## 2019-12-14 NOTE — ED Notes (Signed)
Pt resting at this time. No further needs.

## 2019-12-14 NOTE — H&P (Signed)
History and Physical    Luke GandyDwight J Bleich ZOX:[id number] DOB: 04/15/1941 DOA: 12/14/2019  PCP: Jaclyn Shaggyate, Denny C, MD Patient coming from: Home  I have personally briefly [facility]  Chief Complaint: Fatigue, malaise, recent UTI and bacteremia  HPI: Luke Haynes is a 78 y.o. male with medical history significant of hypertension, recurrent UTIs who was initially admitted and treated for UTI and associated E. coli bacteremia and was discharged on 12/11/2019.  On the previous presentation he endorsed laying in bed for 2 to 3 days prior to presenting to the ED urinating and stooling on himself.  At baseline he is able to ambulate.  After discharge she "did not feel right yet" and was not able to handle his activities of daily living at home.  He was discharged on p.o. cephalexin per culture sensitivities and did endorse adherence to the medication regimen however symptoms did not resolve and he presented back to the emergency department.    On presentation in the emergency department he does have an elevated white count of 20.9 with neutrophilic predominance.  Remainder of metabolic profile overall reassuring.  CT abdomen pelvis was performed to rule out infected stone and no stone or hydronephrosis was noted.  Considering the patient's persistent symptoms internal medicine was called for admission.  ED Course: As above.  Presentation patient was given 1L normal saline fluid bolus followed by 1 dose of vancomycin and Zosyn.  Blood cultures and urine cultures were drawn.  Urinalysis is not overtly consistent with infection.  Blood cultures are pending at time of this note.  Internal medicine called for admission.  Review of Systems: As per HPI otherwise 10 point review of systems negative.   Past Medical History:  Diagnosis Date  . Hypertension     History reviewed. No pertinent surgical history.   reports that he has never smoked. He has never used  smokeless tobacco. He reports previous alcohol use. He reports that he does not use drugs.  Allergies  Allergen Reactions  . Sulfa Antibiotics     Family History  Problem Relation Age of Onset  . COPD Father     Prior to Admission medications   Medication Sig Start Date End Date Taking? Authorizing Provider  ALPRAZolam Prudy Feeler(XANAX) 0.5 MG tablet Take 0.5 mg by mouth 3 (three) times daily. 11/09/19  Yes [provider]  amLODipine (NORVASC) 10 MG tablet Take 10 mg by mouth daily. 11/24/19  Yes [provider]  cephALEXin (KEFLEX) 500 MG capsule Take 1 capsule (500 mg total) by mouth every 6 (six) hours. 12/11/19  Yes Lynn ItoAmery, Sahar, MD  enalapril (VASOTEC) 5 MG tablet Take 1 tablet (5 mg total) by mouth daily. 12/11/19  Yes Lynn ItoAmery, Sahar, MD  HYDROcodone-acetaminophen (NORCO) 7.5-325 MG tablet Take 1 tablet by mouth every 8 (eight) hours as needed. 11/18/19  Yes [provider]  lactobacillus acidophilus & bulgar (LACTINEX) chewable tablet Chew 1 tablet by mouth 3 (three) times daily with meals. 12/11/19  Yes Lynn ItoAmery, Sahar, MD  nebivolol (BYSTOLIC) 10 MG tablet Take 1 tablet (10 mg total) by mouth daily. 12/12/19  Yes Lynn ItoAmery, Sahar, MD  tamsulosin (FLOMAX) 0.4 MG CAPS capsule Take 0.4 mg by mouth daily. 10/13/19  Yes [provider]  feeding supplement, ENSURE ENLIVE, (ENSURE ENLIVE) LIQD Take 237 mLs by mouth 2 (two) times daily between meals. 12/11/19   Lynn ItoAmery, Sahar, MD    Physical Exam: Vitals:   12/14/19 1615 12/14/19 1630 12/14/19  1645 12/14/19 1700  BP:  138/78  (!) 152/75  Pulse: 68 67 66 68  Resp: (!) 21 (!) 27 (!) 28 (!) 21  Temp:      TempSrc:      SpO2: 98% 99% 98% 98%  Weight:      Height:         Vitals:   12/14/19 1615 12/14/19 1630 12/14/19 1645 12/14/19 1700  BP:  138/78  (!) 152/75  Pulse: 68 67 66 68  Resp: (!) 21 (!) 27 (!) 28 (!) 21  Temp:      TempSrc:      SpO2: 98% 99% 98% 98%  Weight:      Height:       Constitutional:  NAD, calm, comfortable Eyes: PERRL, lids and conjunctivae normal ENMT: Mucous membranes are moist. Posterior pharynx clear of any exudate or lesions.Normal dentition.  Neck: normal, supple, no masses, no thyromegaly Respiratory: clear to auscultation bilaterally, no wheezing, no crackles. Normal respiratory effort. No accessory muscle use.  Cardiovascular: Regular rate and rhythm, no murmurs / rubs / gallops. No extremity edema. 2+ pedal pulses. No carotid bruits.  Abdomen: no tenderness, no masses palpated. No hepatosplenomegaly. Bowel sounds positive.  Musculoskeletal: no clubbing / cyanosis. No joint deformity upper and lower extremities. Good ROM, no contractures. Normal muscle tone.  Skin: no rashes, lesions, ulcers. No induration Neurologic: CN 2-12 grossly intact. Sensation intact, DTR normal. Strength 5/5 in all 4.  Psychiatric: Normal judgment and insight. Alert and oriented x 3. Normal mood.     Labs on Admission: I have personally reviewed following labs and imaging studies  CBC: Recent Labs  Lab 12/08/19 0528 12/09/19 0735 12/10/19 0755 12/11/19 0524 12/14/19 1301  WBC 18.2* 19.9* 16.4* 14.2* 20.9*  NEUTROABS  --  17.4*  --   --  16.9*  HGB 10.8* 10.5* 11.2* 11.0* 13.2  HCT 31.8* 31.0* 33.9* 33.8* 40.2  MCV 87.1 87.1 88.3 89.2 89.1  PLT 267 271 281 330 599*   Basic Metabolic Panel: Recent Labs  Lab 12/07/19 1930 12/08/19 0528 12/09/19 0735 12/10/19 0755 12/14/19 1301  NA  --  137 133* 137 134*  K  --  3.3* 3.5 3.8 3.9  CL  --  100 99 101 96*  CO2  --  25 24 28 26   GLUCOSE  --  138* 169* 170* 193*  BUN  --  40* 44* 35* 17  CREATININE  --  1.38* 1.36* 1.20 0.99  CALCIUM  --  8.0* 8.1* 8.2* 8.8*  MG 2.2 2.4  --   --   --   PHOS 2.2* 3.2  --   --   --    GFR: Estimated Creatinine Clearance: 68.2 mL/min (by C-G formula based on SCr of 0.99 mg/dL). Liver Function Tests: Recent Labs  Lab 12/08/19 0528 12/14/19 1301  AST 60* 44*  ALT 34 42  ALKPHOS 52  177*  BILITOT 1.0 1.0  PROT 5.6* 7.7  ALBUMIN 2.3* 3.0*   No results for input(s): LIPASE, AMYLASE in the last 168 hours. No results for input(s): AMMONIA in the last 168 hours. Coagulation Profile: Recent Labs  Lab 12/14/19 1301  INR 0.9   Cardiac Enzymes: Recent Labs  Lab 12/07/19 1930 12/08/19 0528 12/09/19 0742  CKTOTAL 944* 460* 140   BNP (last 3 results) No results for input(s): PROBNP in the last 8760 hours. HbA1C: No results for input(s): HGBA1C in the last 72 hours. CBG: No results for input(s): GLUCAP in  the last 168 hours. Lipid Profile: No results for input(s): CHOL, HDL, LDLCALC, TRIG, CHOLHDL, LDLDIRECT in the last 72 hours. Thyroid Function Tests: No results for input(s): TSH, T4TOTAL, FREET4, T3FREE, THYROIDAB in the last 72 hours. Anemia Panel: No results for input(s): VITAMINB12, FOLATE, FERRITIN, TIBC, IRON, RETICCTPCT in the last 72 hours. Urine analysis:    Component Value Date/Time   COLORURINE YELLOW (A) 12/14/2019 1301   APPEARANCEUR CLEAR (A) 12/14/2019 1301   LABSPEC 1.007 12/14/2019 1301   PHURINE 6.0 12/14/2019 1301   GLUCOSEU >=500 (A) 12/14/2019 1301   HGBUR MODERATE (A) 12/14/2019 1301   BILIRUBINUR NEGATIVE 12/14/2019 1301   KETONESUR NEGATIVE 12/14/2019 1301   PROTEINUR 30 (A) 12/14/2019 1301   NITRITE NEGATIVE 12/14/2019 1301   LEUKOCYTESUR TRACE (A) 12/14/2019 1301    Radiological Exams on Admission: DG Chest Port 1 View  Result Date: 12/14/2019 CLINICAL DATA:  Fever today.  Patient recently diagnosed with a UTI. EXAM: PORTABLE CHEST 1 VIEW COMPARISON:  Single-view of the chest 12/07/2019. FINDINGS: Lungs clear. Heart size normal. No pneumothorax or pleural fluid. No bony abnormality. IMPRESSION: Negative chest. Electronically Signed   By: Inge Rise M.D.   On: 12/14/2019 13:15   CT Renal Stone Study  Result Date: 12/14/2019 CLINICAL DATA:  Hematuria EXAM: CT ABDOMEN AND PELVIS WITHOUT CONTRAST TECHNIQUE: Multidetector  CT imaging of the abdomen and pelvis was performed following the standard protocol without IV contrast. COMPARISON:  None. FINDINGS: Lower chest: Small right pleural effusion. Trace left pleural effusion. Mild bibasilar atelectasis. Hepatobiliary: No focal liver abnormality is seen. No gallstones, gallbladder wall thickening, or biliary dilatation. Pancreas: Unremarkable. Spleen: Unremarkable Adrenals/Urinary Tract: Adrenals are unremarkable. Small bilateral renal cysts. Mild perinephric stranding of which could be chronic. Limited evaluation for pyelonephritis on this study. There is no hydronephrosis. No calculi identified. Bladder is unremarkable. Stomach/Bowel: Few colonic diverticula.  Normal appendix. Vascular/Lymphatic: Aortic atherosclerosis. No enlarged abdominal or pelvic lymph nodes. Reproductive: Prostate is unremarkable. Other: Mild mesenteric infiltration. Right inguinal hernia containing distal ileum. Left inguinal hernia containing sigmoid. Musculoskeletal: Degenerative changes of the included spine. IMPRESSION: No urinary tract calculi or hydronephrosis. Bilateral bowel containing inguinal hernias. Small pleural effusions and bibasilar atelectasis. Electronically Signed   By: Macy Mis M.D.   On: 12/14/2019 15:24    EKG: Independently reviewed.  Normal sinus rhythm  Assessment/Plan Active Problems:   Sepsis secondary to UTI ([place])   Sepsis secondary to UTI Recent E. coli and Enterobacter bacteremia Leukocytosis Fatigue/malaise Reduced functional status Patient recently discharged on 12/11/2019 Unable to handle activities of daily living at home On representation patient has elevated white count above value at discharge Patient pancultured in ED Received 1 dose of vancomycin and Zosyn in ED Plan: Admit inpatient with working diagnosis of sepsis secondary to UTI Possible nonresponse to oral antibiotic regimen Initiate cefepime 2 g every 12 hours Pharmacy for dosing  consultation Follow blood and urine cultures APAP for fever Vital signs per unit protocol Physical therapy consult  Essential hypertension  Continue home medication regimen of ACE inhibitor Norvasc  Bystolic hydrochlorothiazide   DVT prophylaxis: Lovenox Code Status: Full Family Communication: None today Disposition Plan: 48 hours, anticipate home Consults called: None Admission status: Inpatient   Sidney Ace MD [facility] Pager [phone]  If 7PM-7AM, please contact night-coverage [email or web address] Password [email or web address]  12/14/2019, 5:30 PM

## 2019-12-14 NOTE — ED Notes (Signed)
Pt given meal tray. Pt also changed with new brief.

## 2019-12-15 ENCOUNTER — Inpatient Hospital Stay: Payer: Medicare HMO

## 2019-12-15 ENCOUNTER — Encounter: Payer: Self-pay | Admitting: Internal Medicine

## 2019-12-15 DIAGNOSIS — A419 Sepsis, unspecified organism: Principal | ICD-10-CM

## 2019-12-15 DIAGNOSIS — M25572 Pain in left ankle and joints of left foot: Secondary | ICD-10-CM

## 2019-12-15 DIAGNOSIS — M25571 Pain in right ankle and joints of right foot: Secondary | ICD-10-CM

## 2019-12-15 DIAGNOSIS — N390 Urinary tract infection, site not specified: Secondary | ICD-10-CM

## 2019-12-15 LAB — PROTIME-INR
INR: 1 (ref 0.8–1.2)
Prothrombin Time: 12.8 seconds (ref 11.4–15.2)

## 2019-12-15 LAB — BASIC METABOLIC PANEL
Anion gap: 8 (ref 5–15)
BUN: 20 mg/dL (ref 8–23)
CO2: 28 mmol/L (ref 22–32)
Calcium: 8.3 mg/dL — ABNORMAL LOW (ref 8.9–10.3)
Chloride: 102 mmol/L (ref 98–111)
Creatinine, Ser: 0.89 mg/dL (ref 0.61–1.24)
GFR calc Af Amer: >60 mL/min (ref >60)
GFR calc non Af Amer: >60 mL/min (ref >60)
Glucose, Bld: 142 mg/dL — ABNORMAL HIGH (ref 70–99)
Potassium: 3.7 mmol/L (ref 3.5–5.1)
Sodium: 138 mmol/L (ref 135–145)

## 2019-12-15 LAB — HEMOGLOBIN A1C
Hgb A1c MFr Bld: 6.3 % — ABNORMAL HIGH (ref 4.8–5.6)
Mean Plasma Glucose: 134.11 mg/dL

## 2019-12-15 LAB — CBC
HCT: 31.9 % — ABNORMAL LOW (ref 39.0–52.0)
Hemoglobin: 10.9 g/dL — ABNORMAL LOW (ref 13.0–17.0)
MCH: 29.1 pg (ref 26.0–34.0)
MCHC: 34.2 g/dL (ref 30.0–36.0)
MCV: 85.3 fL (ref 80.0–100.0)
Platelets: 492 10*3/uL — ABNORMAL HIGH (ref 150–400)
RBC: 3.74 MIL/uL — ABNORMAL LOW (ref 4.22–5.81)
RDW: 12.5 % (ref 11.5–15.5)
WBC: 13.5 10*3/uL — ABNORMAL HIGH (ref 4.0–10.5)
nRBC: 0 % (ref 0.0–0.2)

## 2019-12-15 LAB — PROCALCITONIN: Procalcitonin: 0.11 ng/mL

## 2019-12-15 LAB — URINE CULTURE: Culture: NO GROWTH

## 2019-12-15 LAB — CORTISOL-AM, BLOOD: Cortisol - AM: 12.7 ug/dL (ref 6.7–22.6)

## 2019-12-15 NOTE — Progress Notes (Signed)
Pharmacy Antibiotic Note  Luke Haynes is a 78 y.o. male admitted on 12/14/2019 with UTI/sepsis  Pharmacy has been consulted for Cefepime dosing.  Recent admission 12/23-12/27/2020 for UTI/bacteremia and was on Meropenem then discharged home on cephalexin. Patients stated that he did take antibiotics as prescribed but never felt back to normal after discharge.   Plan: Day 2 IV abx.  Will continue Cefepime 2 gm IV q8h for Sepsis secondary to UTI.  Will continue to F/u on cultures and renal fxn and adjust as needed.    Height: 5\' 9"  (175.3 cm) Weight: 212 lb (96.2 kg) IBW/kg (Calculated) : 70.7  Temp (24hrs), Avg:98.3 F (36.8 C), Min:98.2 F (36.8 C), Max:98.4 F (36.9 C)  Recent Labs  Lab 12/09/19 0735 12/10/19 0755 12/11/19 0524 12/14/19 1301 12/15/19 0432  WBC 19.9* 16.4* 14.2* 20.9* 13.5*  CREATININE 1.36* 1.20  --  0.99 0.89  LATICACIDVEN  --   --   --  1.9  --     Estimated Creatinine Clearance: 78.3 mL/min (by C-G formula based on SCr of 0.89 mg/dL).    Allergies  Allergen Reactions  . Sulfa Antibiotics     Antimicrobials this admission: Ceftriaxone 12/23>> 12/24 Meropenem 12/25>> 12/27   Vanc 12/30 x 1 Zosyn 12/30  x1 Cefepime 12/30 >>  Microbiology results: 12/30 BCx: pending 12/30 UCx: pending  Thank you for allowing pharmacy to be a part of this patient's care.  Pernell Dupre, PharmD, BCPS Clinical Pharmacist 12/15/2019 8:03 AM

## 2019-12-15 NOTE — Progress Notes (Signed)
PROGRESS NOTE    Luke Haynes  ZOX:[id number] DOB: [birth year] DOA: 12/14/2019 PCP: Jaclyn Shaggy, MD      Assessment & Plan:   Active Problems:   Sepsis secondary to UTI (HCC)  Sepsis: secondary to UTI. Continue on IV cefepime. Blood & urine cxs: pending   UTI: UA shows trace leukocytosis, urine cx is pending. Continue on IV cefepime  B/l ankle pain: pt denies any recent falls or trauma. XR b/l ankle ordered.   Leukocytosis: secondary to infection. Continue on IV abxs  Thrombocytosis: likely reactive. Will continue to monitor   Normocytic anemia: no need for a transfusion at this time. Will continue to monitor   Generalized weakness: PT pending. OT recommends SNF. Pt is agreeable to SNF  DVT prophylaxis: lovenox Code Status: full  Family Communication:  Disposition Plan:    Consultants:   n/a   Procedures: n/a  Antimicrobials: cefepime   Subjective: Pt c/o b/l ankle pain.  Objective: Vitals:   12/14/19 1945 12/14/19 2000 12/14/19 2256 12/14/19 2359  BP:  136/78 132/72 (!) 150/83  Pulse: 77 78 85 85  Resp: (!) 30 (!) 26  19  Temp:   98.4 F (36.9 C) 98.3 F (36.8 C)  TempSrc:   Oral Oral  SpO2: 97% 97% 97% 98%  Weight:    96.2 kg  Height:     (1.753 m)    Intake/Output Summary (Last 24 hours) at 12/15/2019 0733 Last data filed at 12/15/2019 0400 Gross per 24 hour  Intake 102.97 ml  Output 175 ml  Net -72.03 ml   Filed Weights   12/14/19 1301 12/14/19 2359  Weight: 90 kg 96.2 kg    Examination:  General exam: Appears calm and comfortable   Respiratory system: diminished breath sounds b/l. No rales  Cardiovascular system: S1 & S2 +. No  rubs, gallops or clicks.  Gastrointestinal system: Abdomen is nondistended, soft and nontender. Normal bowel sounds heard. Central nervous system: Alert and oriented. No focal neurological deficits. Psychiatry: Judgement and insight appear normal. Flat mood and affect.     Data Reviewed: I have  personally reviewed following labs and imaging studies  CBC: Recent Labs  Lab 12/09/19 0735 12/10/19 0755 12/11/19 0524 12/14/19 1301 12/15/19 0432  WBC 19.9* 16.4* 14.2* 20.9* 13.5*  NEUTROABS 17.4*  --   --  16.9*  --   HGB 10.5* 11.2* 11.0* 13.2 10.9*  HCT 31.0* 33.9* 33.8* 40.2 31.9*  MCV 87.1 88.3 89.2 89.1 85.3  PLT 271 281 330 599* 492*   Basic Metabolic Panel: Recent Labs  Lab 12/09/19 0735 12/10/19 0755 12/14/19 1301 12/15/19 0432  NA 133* 137 134* 138  K 3.5 3.8 3.9 3.7  CL 99 101 96* 102  CO2 GLUCOSE 169* 170* 193* 142*  BUN 44* 35* 17 20  CREATININE 1.36* 1.20 0.99 0.89  CALCIUM 8.1* 8.2* 8.8* 8.3*   GFR: Estimated Creatinine Clearance: 78.3 mL/min (by C-G formula based on SCr of 0.89 mg/dL). Liver Function Tests: Recent Labs  Lab 12/14/19 1301  AST 44*  ALT 42  ALKPHOS 177*  BILITOT 1.0  PROT 7.7  ALBUMIN 3.0*   No results for input(s): LIPASE, AMYLASE in the last 168 hours. No results for input(s): AMMONIA in the last 168 hours. Coagulation Profile: Recent Labs  Lab 12/14/19 1301 12/15/19 0432  INR 0.9 1.0   Cardiac Enzymes: Recent Labs  Lab 12/09/19 0742  CKTOTAL 140   BNP (last 3 results)  No results for input(s): PROBNP in the last 8760 hours. HbA1C: No results for input(s): HGBA1C in the last 72 hours. CBG: No results for input(s): GLUCAP in the last 168 hours. Lipid Profile: No results for input(s): CHOL, HDL, LDLCALC, TRIG, CHOLHDL, LDLDIRECT in the last 72 hours. Thyroid Function Tests: No results for input(s): TSH, T4TOTAL, FREET4, T3FREE, THYROIDAB in the last 72 hours. Anemia Panel: No results for input(s): VITAMINB12, FOLATE, FERRITIN, TIBC, IRON, RETICCTPCT in the last 72 hours. Sepsis Labs: Recent Labs  Lab 12/14/19 1301 12/15/19 0432  PROCALCITON  --  0.11  LATICACIDVEN 1.9  --     Recent Results (from the past 240 hour(s))  Blood culture (routine single)     Status: Abnormal   Collection Time:  12/07/19  4:30 PM   Specimen: BLOOD  Result Value Ref Range Status   Specimen Description   Final    BLOOD LEFT ANTECUBITAL Performed at [facility], [address], [place], [place] [address]    Special Requests   Final    BOTTLES DRAWN AEROBIC AND ANAEROBIC Blood Culture adequate volume Performed at [facility], [address], [place], [place] [address]    Culture  Setup Time   Final    GRAM NEGATIVE RODS AEROBIC BOTTLE ONLY CRITICAL RESULT CALLED TO, READ BACK BY AND VERIFIED WITH: Lajuan Lines @ 1934 on 12/11/2019 by caf Performed at [facility], [address] [place], [place] [address]    Culture (A)  Final    ESCHERICHIA COLI SUSCEPTIBILITIES PERFORMED ON PREVIOUS CULTURE WITHIN THE LAST 5 DAYS. Performed at [facility], [address], [place], [place] [address]    Report Status 12/14/2019 FINAL  Final  Urine culture     Status: Abnormal   Collection Time: 12/07/19  5:07 PM   Specimen: In/Out Cath Urine  Result Value Ref Range Status   Specimen Description   Final    IN/OUT CATH URINE Performed at [facility], [address] [place], [place] [address]    Special Requests   Final    NONE Performed at [facility], [address] [place], [place] [address]    Culture >=100,000 COLONIES/mL ESCHERICHIA COLI (A)  Final   Report Status 12/10/2019 FINAL  Final   Organism ID, Bacteria ESCHERICHIA COLI (A)  Final      Susceptibility   Escherichia coli - MIC*    AMPICILLIN >=32 RESISTANT Resistant     CEFAZOLIN <=4 SENSITIVE Sensitive     CEFTRIAXONE <=1 SENSITIVE Sensitive     CIPROFLOXACIN <=0.25 SENSITIVE Sensitive     GENTAMICIN <=1 SENSITIVE Sensitive     IMIPENEM <=0.25 SENSITIVE Sensitive     NITROFURANTOIN <=16 SENSITIVE Sensitive     TRIMETH/SULFA <=20 SENSITIVE Sensitive     AMPICILLIN/SULBACTAM >=32 RESISTANT Resistant     PIP/TAZO <=4 SENSITIVE Sensitive     * >=100,000 COLONIES/mL ESCHERICHIA  COLI  Blood culture (single)     Status: Abnormal   Collection Time: 12/07/19  6:22 PM   Specimen: BLOOD  Result Value Ref Range Status   Specimen Description   Final    BLOOD BLOOD LEFT ARM Performed at [facility], [address], [place], [place] [address]    Special Requests   Final    BOTTLES DRAWN AEROBIC AND ANAEROBIC Blood Culture adequate volume Performed at [facility], [address], [place], [place] [address]    Culture  Setup Time   Final  GRAM NEGATIVE RODS AEROBIC BOTTLE ONLY CRITICAL RESULT CALLED TO, READ BACK BY AND VERIFIED WITH: SCOTT HALL 12/10/2019 AT 0405 HS Performed at [facility], [address] [place], [place] [address]    Culture ESCHERICHIA COLI (A)  Final   Report Status 12/12/2019 FINAL  Final   Organism ID, Bacteria ESCHERICHIA COLI  Final      Susceptibility   Escherichia coli - MIC*    AMPICILLIN >=32 RESISTANT Resistant     CEFAZOLIN <=4 SENSITIVE Sensitive     CEFEPIME <=1 SENSITIVE Sensitive     CEFTAZIDIME <=1 SENSITIVE Sensitive     CEFTRIAXONE <=1 SENSITIVE Sensitive     CIPROFLOXACIN <=0.25 SENSITIVE Sensitive     GENTAMICIN <=1 SENSITIVE Sensitive     IMIPENEM <=0.25 SENSITIVE Sensitive     TRIMETH/SULFA <=20 SENSITIVE Sensitive     AMPICILLIN/SULBACTAM 16 INTERMEDIATE Intermediate     PIP/TAZO <=4 SENSITIVE Sensitive     * ESCHERICHIA COLI  SARS CORONAVIRUS 2 (TAT 6-24 HRS) Nasopharyngeal Nasopharyngeal Swab     Status: None   Collection Time: 12/07/19  6:22 PM   Specimen: Nasopharyngeal Swab  Result Value Ref Range Status   SARS Coronavirus 2 NEGATIVE NEGATIVE Final    Comment: (NOTE) SARS-CoV-2 target nucleic acids are NOT DETECTED. The SARS-CoV-2 RNA is generally detectable in upper and lower respiratory specimens during the acute phase of infection. Negative results do not preclude SARS-CoV-2 infection, do not rule out co-infections with other pathogens, and should not be used as the sole  basis for treatment or other patient management decisions. Negative results must be combined with clinical observations, patient history, and epidemiological information. The expected result is Negative. Fact Sheet for Patients: SugarRoll.be Fact Sheet for Healthcare Providers: https://www.woods-mathews.com/ This test is not yet approved or cleared by the [place] FDA and  has been authorized for detection and/or diagnosis of SARS-CoV-2 by FDA under an Emergency Use Authorization (EUA). This EUA will remain  in effect (meaning this test can be used) for the duration of the COVID-19 declaration under Section 56 4(b)(1) of the Act, 21 U.S.C. section 360bbb-3(b)(1), unless the authorization is terminated or revoked sooner. Performed at [facility], [address] [place], [place] [address]   Blood Culture ID Panel (Reflexed)     Status: Abnormal   Collection Time: 12/07/19  6:22 PM  Result Value Ref Range Status   Enterococcus species NOT DETECTED NOT DETECTED Final   Listeria monocytogenes NOT DETECTED NOT DETECTED Final   Staphylococcus species NOT DETECTED NOT DETECTED Final   Staphylococcus aureus (BCID) NOT DETECTED NOT DETECTED Final   Streptococcus species NOT DETECTED NOT DETECTED Final   Streptococcus agalactiae NOT DETECTED NOT DETECTED Final   Streptococcus pneumoniae NOT DETECTED NOT DETECTED Final   Streptococcus pyogenes NOT DETECTED NOT DETECTED Final   Acinetobacter baumannii NOT DETECTED NOT DETECTED Final   Enterobacteriaceae species DETECTED (A) NOT DETECTED Final    Comment: Enterobacteriaceae represent a large family of gram-negative bacteria, not a single organism. CRITICAL RESULT CALLED TO, READ BACK BY AND VERIFIED WITH: SCOTT HALL 12/10/2019 AT 0405 HS    Enterobacter cloacae complex NOT DETECTED NOT DETECTED Final   Escherichia coli DETECTED (A) NOT DETECTED Final    Comment: CRITICAL RESULT CALLED TO,  READ BACK BY AND VERIFIED WITH: SCOTT HALL 12/10/2019 AT 0405 HS    Klebsiella oxytoca NOT DETECTED NOT DETECTED Final   Klebsiella pneumoniae NOT DETECTED NOT DETECTED Final   Proteus species NOT  DETECTED NOT DETECTED Final   Serratia marcescens NOT DETECTED NOT DETECTED Final   Carbapenem resistance NOT DETECTED NOT DETECTED Final   Haemophilus influenzae NOT DETECTED NOT DETECTED Final   Neisseria meningitidis NOT DETECTED NOT DETECTED Final   Pseudomonas aeruginosa NOT DETECTED NOT DETECTED Final   Candida albicans NOT DETECTED NOT DETECTED Final   Candida glabrata NOT DETECTED NOT DETECTED Final   Candida krusei NOT DETECTED NOT DETECTED Final   Candida parapsilosis NOT DETECTED NOT DETECTED Final   Candida tropicalis NOT DETECTED NOT DETECTED Final    Comment: Performed at [facility], [address] [place], [place] [address]  Respiratory Panel by RT PCR (Flu A&B, Covid) - Nasopharyngeal Swab     Status: None   Collection Time: 12/14/19  1:02 PM   Specimen: Nasopharyngeal Swab  Result Value Ref Range Status   SARS Coronavirus 2 by RT PCR NEGATIVE NEGATIVE Final    Comment: (NOTE) SARS-CoV-2 target nucleic acids are NOT DETECTED. The SARS-CoV-2 RNA is generally detectable in upper respiratoy specimens during the acute phase of infection. The lowest concentration of SARS-CoV-2 viral copies this assay can detect is 131 copies/mL. A negative result does not preclude SARS-Cov-2 infection and should not be used as the sole basis for treatment or other patient management decisions. A negative result Bonser occur with  improper specimen collection/handling, submission of specimen other than nasopharyngeal swab, presence of viral mutation(s) within the areas targeted by this assay, and inadequate number of viral copies (<131 copies/mL). A negative result must be combined with clinical observations, patient history, and epidemiological information. The expected result is  Negative. Fact Sheet for Patients:  https://www.moore.com/https://www.fda.gov/media/142436/download Fact Sheet for Healthcare Providers:  https://www.young.biz/https://www.fda.gov/media/142435/download This test is not yet ap proved or cleared by the [place] FDA and  has been authorized for detection and/or diagnosis of SARS-CoV-2 by FDA under an Emergency Use Authorization (EUA). This EUA will remain  in effect (meaning this test can be used) for the duration of the COVID-19 declaration under Section 564(b)(1) of the Act, 21 U.S.C. section 360bbb-3(b)(1), unless the authorization is terminated or revoked sooner.    Influenza A by PCR NEGATIVE NEGATIVE Final   Influenza B by PCR NEGATIVE NEGATIVE Final    Comment: (NOTE) The Xpert Xpress SARS-CoV-2/FLU/RSV assay is intended as an aid in  the diagnosis of influenza from Nasopharyngeal swab specimens and  should not be used as a sole basis for treatment. Nasal washings and  aspirates are unacceptable for Xpert Xpress SARS-CoV-2/FLU/RSV  testing. Fact Sheet for Patients: https://www.moore.com/https://www.fda.gov/media/142436/download Fact Sheet for Healthcare Providers: https://www.young.biz/https://www.fda.gov/media/142435/download This test is not yet approved or cleared by the [place] FDA and  has been authorized for detection and/or diagnosis of SARS-CoV-2 by  FDA under an Emergency Use Authorization (EUA). This EUA will remain  in effect (meaning this test can be used) for the duration of the  Covid-19 declaration under Section 564(b)(1) of the Act, 21  U.S.C. section 360bbb-3(b)(1), unless the authorization is  terminated or revoked. Performed at [facility], [address] [place], [place] [address]          Radiology Studies: [facility] [place] [facility]  Result Date: 12/14/2019 CLINICAL DATA:  Fever today.  Patient recently diagnosed with a UTI. EXAM: PORTABLE CHEST 1 VIEW COMPARISON:  Single-view of the chest 12/07/2019. FINDINGS: Lungs clear. Heart size normal. No pneumothorax or  pleural fluid. No bony abnormality. IMPRESSION: Negative chest. Electronically Signed   By: Drusilla Kannerhomas  Dalessio M.D.  On: 12/14/2019 13:15   CT Renal Stone Study  Result Date: 12/14/2019 CLINICAL DATA:  Hematuria EXAM: CT ABDOMEN AND PELVIS WITHOUT CONTRAST TECHNIQUE: Multidetector CT imaging of the abdomen and pelvis was performed following the standard protocol without IV contrast. COMPARISON:  None. FINDINGS: Lower chest: Small right pleural effusion. Trace left pleural effusion. Mild bibasilar atelectasis. Hepatobiliary: No focal liver abnormality is seen. No gallstones, gallbladder wall thickening, or biliary dilatation. Pancreas: Unremarkable. Spleen: Unremarkable Adrenals/Urinary Tract: Adrenals are unremarkable. Small bilateral renal cysts. Mild perinephric stranding of which could be chronic. Limited evaluation for pyelonephritis on this study. There is no hydronephrosis. No calculi identified. Bladder is unremarkable. Stomach/Bowel: Few colonic diverticula.  Normal appendix. Vascular/Lymphatic: Aortic atherosclerosis. No enlarged abdominal or pelvic lymph nodes. Reproductive: Prostate is unremarkable. Other: Mild mesenteric infiltration. Right inguinal hernia containing distal ileum. Left inguinal hernia containing sigmoid. Musculoskeletal: Degenerative changes of the included spine. IMPRESSION: No urinary tract calculi or hydronephrosis. Bilateral bowel containing inguinal hernias. Small pleural effusions and bibasilar atelectasis. Electronically Signed   By: Guadlupe Spanish M.D.   On: 12/14/2019 15:24        Scheduled Meds: . ALPRAZolam  0.5 mg Oral TID  . amLODipine  10 mg Oral Daily  . enalapril  5 mg Oral Daily  . enoxaparin (LOVENOX) injection  40 mg Subcutaneous Q24H  . feeding supplement (ENSURE ENLIVE)  237 mL Oral BID BM  . folic acid  1 mg Oral Daily  . multivitamin with minerals  1 tablet Oral Daily  . nebivolol  10 mg Oral Daily  . tamsulosin  0.4 mg Oral Daily  . thiamine   100 mg Oral Daily   Continuous Infusions: . ceFEPime (MAXIPIME) IV 2 g (12/15/19 0253)     LOS: 1 day    Time spent: 33 mins     Charise Killian, MD [facility] Pager [phone]  If 7PM-7AM, please contact night-coverage [email or web address] Password [facility] 12/15/2019, 7:33 AM

## 2019-12-15 NOTE — Evaluation (Signed)
Occupational Therapy Evaluation Patient Details Name: Luke Haynes MRN: [id number] DOB: [birth year] Today's Date: 12/15/2019    History of Present Illness Luke Haynes is a 78 y.o. male with medical history significant of hypertension, recurrent UTIs who was initially admitted and treated for UTI and associated E. coli bacteremia and was discharged on 12/11/2019.  On the previous presentation he endorsed laying in bed for 2 to 3 days prior to presenting to the ED urinating and stooling on himself.  At baseline he is able to ambulate.  After discharge she "did not feel right yet" and was not able to handle his activities of daily living at home.  He was discharged on p.o. cephalexin per culture sensitivities and did endorse adherence to the medication regimen however symptoms did not resolve and he presented back to the emergency department   Clinical Impression   Pt seen for OT evaluation this date. Pt recently admitted and evaluated by this therapist. Prior to recent hospital admissions, pt was ambulating and performing ADL without assist. Pt lives with his daughter in law (she uses a RW) and 2 twin 5yo children.  Currently pt demonstrates impairments in strength, activity tolerance, and balance requiring Mod-Max assist for functional ADL transfers from std height surfaces, improving to Min-Mod assist for standing from elevated surfaces, as well as cues for hand placement and RW mgt. Pt tolerated standing at sink >10 min with intermittent UE support on counter to improve balance. VSS throughout. Pt demonstrates greater functional deficits this date than previously seen last admission, requiring increased assist for ADL transfers and LB ADL tasks. Pt at higher falls risk. Pt would benefit from skilled OT to address noted impairments and functional limitations (see below for any additional details) in order to maximize safety and independence while minimizing falls risk and caregiver burden.  Upon hospital  discharge, recommend pt discharge to SNF to support safer ultimate discharge back home.    Follow Up Recommendations  SNF    Equipment Recommendations  3 in 1 bedside commode    Recommendations for Other Services       Precautions / Restrictions Precautions Precautions: Fall Restrictions Weight Bearing Restrictions: No      Mobility Bed Mobility Overal bed mobility: Needs Assistance Bed Mobility: Supine to Sit;Sit to Supine     Supine to sit: Supervision Sit to supine: Supervision   General bed mobility comments: additional time/effort, heavy UE support  Transfers Overall transfer level: Needs assistance Equipment used: Rolling walker (2 wheeled) Transfers: Sit to/from Stand Sit to Stand: Mod assist;From elevated surface;Min assist         General transfer comment: initially unable to stand without significant assist with bed at standard height; with bed elevated, able to perform with less assist    Balance Overall balance assessment: Needs assistance Sitting-balance support: No upper extremity supported;Feet supported Sitting balance-Leahy Scale: Fair     Standing balance support: Bilateral upper extremity supported;Single extremity supported;During functional activity Standing balance-Leahy Scale: Fair Standing balance comment: slight posterior LOB if he let go of counter while standing at sink for grooming tasks                           ADL either performed or assessed with clinical judgement   ADL Overall ADL's : Needs assistance/impaired     Grooming: Min guard;Wash/dry face;Wash/dry hands;Applying deodorant;Standing Grooming Details (indicate cue type and reason): standing inside RW at sink with LUE/RUE support on counter  at times, pt able to perform grooming, required at least 1 UE support on counter with slight posterior LOB otherwise     Lower Body Bathing: Moderate assistance;Sit to/from stand   Upper Body Dressing :  Sitting;Supervision/safety;Set up   Lower Body Dressing: Sit to/from stand;Moderate assistance   Toilet Transfer: Moderate assistance;Ambulation;BSC;RW                   Vision Baseline Vision/History: Wears glasses Wears Glasses: Reading only Patient Visual Report: No change from baseline       Perception     Praxis      Pertinent Vitals/Pain Pain Assessment: No/denies pain(reports ankles are a little "sore")     Hand Dominance Right   Extremity/Trunk Assessment Upper Extremity Assessment Upper Extremity Assessment: Overall WFL for tasks assessed(grossly at least 4/5 bilat, 5/5 grip bilat)   Lower Extremity Assessment Lower Extremity Assessment: Defer to PT evaluation   Cervical / Trunk Assessment Cervical / Trunk Assessment: Normal   Communication Communication Communication: No difficulties   Cognition Arousal/Alertness: Awake/alert Behavior During Therapy: WFL for tasks assessed/performed Overall Cognitive Status: Within Functional Limits for tasks assessed                                     General Comments       Exercises Other Exercises Other Exercises: Pt instructed in RW mgt including hand placement, use during functional ADL transfers, and falls prevention strategies   Shoulder Instructions      Home Living Family/patient expects to be discharged to:: Skilled nursing facility Living Arrangements: Children;Other relatives(daughter in law (uses RW) and 2 5yo twin children) Available Help at Discharge: Family Type of Home: House             Bathroom Shower/Tub: Chief Strategy OfficerTub/shower unit   Bathroom Toilet: Standard     Home Equipment: None          Prior Functioning/Environment Level of Independence: Independent        Comments: Prior to recent admissions, at baseline pt is indep with mobility ADL. Frequent recent falls.        OT Problem List: Decreased strength;Decreased range of motion;Cardiopulmonary status  limiting activity;Impaired balance (sitting and/or standing);Decreased knowledge of use of DME or AE      OT Treatment/Interventions: Self-care/ADL training;Therapeutic exercise;Therapeutic activities;DME and/or AE instruction;Patient/family education;Balance training    OT Goals(Current goals can be found in the care plan section) Acute Rehab OT Goals Patient Stated Goal: get stronger OT Goal Formulation: With patient Time For Goal Achievement: 12/29/19 Potential to Achieve Goals: Good ADL Goals Pt Will Perform Lower Body Dressing: with min guard assist;sit to/from stand Pt Will Transfer to Toilet: bedside commode;ambulating;with min assist(LRAD for amb) Additional ADL Goal #1: Pt will perform morning ADL routine standing at sink with supervision for safety.  OT Frequency: Min 1X/week   Barriers to D/C: Decreased caregiver support          Co-evaluation              AM-PAC OT "6 Clicks" Daily Activity     Outcome Measure Help from another person eating meals?: None Help from another person taking care of personal grooming?: A Little Help from another person toileting, which includes using toliet, bedpan, or urinal?: A Lot Help from another person bathing (including washing, rinsing, drying)?: A Lot Help from another person to put on and taking off regular upper  body clothing?: A Little Help from another person to put on and taking off regular lower body clothing?: A Lot 6 Click Score: 16   End of Session Equipment Utilized During Treatment: Gait belt;Rolling walker  Activity Tolerance: Patient tolerated treatment well Patient left: in bed;with call bell/phone within reach;with bed alarm set;with nursing/sitter in room  OT Visit Diagnosis: Other abnormalities of gait and mobility (R26.89);Repeated falls (R29.6);Muscle weakness (generalized) (M62.81)                Time: 5035-4656 OT Time Calculation (min): 21 min Charges:  OT General Charges $OT Visit: 1 Visit OT  Evaluation $OT Eval Low Complexity: 1 Low OT Treatments $Self Care/Home Management : 8-22 mins  Richrd Prime, MPH, MS, OTR/L ascom [phone] 12/15/19, 10:58 AM

## 2019-12-15 NOTE — NC FL2 (Signed)
[place] MEDICAID FL2 LEVEL OF CARE SCREENING TOOL     IDENTIFICATION  Patient Name: Luke Haynes Birthdate: Feb 07, 1941 Sex: male Admission Date (Current Location): 12/14/2019  [place] and [place] Number:  Chiropodist and Address:  [facility], [address], [place], [place] [address]      Provider Number: [id number]  Attending Physician Name and Address:  Charise Killian, MD  Relative Name and Phone Number:       Current Level of Care: Hospital Recommended Level of Care: Skilled Nursing Facility Prior Approval Number:    Date Approved/Denied:   PASRR Number: [id number]  Discharge Plan: SNF    Current Diagnoses: Patient Active Problem List   Diagnosis Date Noted  . Bilateral ankle pain   . Sepsis secondary to UTI (HCC) 12/14/2019  . Acute lower UTI 12/07/2019  . AKI (acute kidney injury) (HCC) 12/07/2019  . Dehydration 12/07/2019  . Essential hypertension 12/07/2019  . Hyponatremia 12/07/2019  . Debility 12/07/2019  . Rhabdomyolysis 12/07/2019  . Elevated troponin 12/07/2019  . Sepsis (HCC) 12/07/2019    Orientation RESPIRATION BLADDER Height & Weight     Self, Time, Situation, Place  Normal External catheter Weight: 96.2 kg Height:  5\' 9"  (175.3 cm)  BEHAVIORAL SYMPTOMS/MOOD NEUROLOGICAL BOWEL NUTRITION STATUS      Continent Diet(2 gram Sodium)  AMBULATORY STATUS COMMUNICATION OF NEEDS Skin   Limited Assist Verbally Normal                       Personal Care Assistance Level of Assistance  Bathing, Dressing Bathing Assistance: Limited assistance Feeding assistance: Limited assistance Dressing Assistance: Limited assistance     Functional Limitations Info             SPECIAL CARE FACTORS FREQUENCY  OT (By licensed OT), PT (By licensed PT)                    Contractures Contractures Info: Not present    Additional Factors Info  Code Status, Allergies Code Status Info: Full Allergies  Info: Sulfa           Current Medications (12/15/2019):  This is the current hospital active medication list Current Facility-Administered Medications  Medication Dose Route Frequency Provider Last Rate Last Admin  . acetaminophen (TYLENOL) tablet 650 mg  650 mg Oral Q6H PRN 12/17/2019 B, MD       Or  . acetaminophen (TYLENOL) suppository 650 mg  650 mg Rectal Q6H PRN Lolita Patella, Sudheer B, MD      . ALPRAZolam Georgeann Oppenheim) tablet 0.5 mg  0.5 mg Oral TID Prudy Feeler B, MD   0.5 mg at 12/15/19 0947  . amLODipine (NORVASC) tablet 10 mg  10 mg Oral Daily 12/17/19 B, MD   10 mg at 12/15/19 0947  . ceFEPIme (MAXIPIME) 2 g in sodium chloride 0.9 % 100 mL IVPB  2 g Intravenous Q8H Sreenath, Sudheer B, MD 200 mL/hr at 12/15/19 1405 2 g at 12/15/19 1405  . enalapril (VASOTEC) tablet 5 mg  5 mg Oral Daily 12/17/19 B, MD   5 mg at 12/15/19 0947  . enoxaparin (LOVENOX) injection 40 mg  40 mg Subcutaneous Q24H 12/17/19 B, MD   40 mg at 12/14/19 2121  . feeding supplement (ENSURE ENLIVE) (ENSURE ENLIVE) liquid 237 mL  237 mL Oral BID BM Sreenath, Sudheer B, MD   237 mL at 12/15/19 1405  . folic acid (FOLVITE)  tablet 1 mg  1 mg Oral Daily Ralene Muskrat B, MD   1 mg at 12/15/19 0947  . HYDROcodone-acetaminophen (NORCO/VICODIN) 5-325 MG per tablet 1-2 tablet  1-2 tablet Oral Q8H PRN Priscella Mann, Sudheer B, MD      . multivitamin with minerals tablet 1 tablet  1 tablet Oral Daily Ralene Muskrat B, MD   1 tablet at 12/15/19 0947  . nebivolol (BYSTOLIC) tablet 10 mg  10 mg Oral Daily Ralene Muskrat B, MD   10 mg at 12/15/19 0947  . senna-docusate (Senokot-S) tablet 1 tablet  1 tablet Oral QHS PRN Sreenath, Sudheer B, MD      . tamsulosin (FLOMAX) capsule 0.4 mg  0.4 mg Oral Daily Priscella Mann, Sudheer B, MD   0.4 mg at 12/15/19 0947  . thiamine tablet 100 mg  100 mg Oral Daily Ralene Muskrat B, MD   100 mg at 12/15/19 0948  . traZODone (DESYREL) tablet 50 mg  50 mg Oral  QHS PRN Sidney Ace, MD         Discharge Medications: Please see discharge summary for a list of discharge medications.  Relevant Imaging Results:  Relevant Lab Results:   Additional Information    Shelbie Ammons, RN

## 2019-12-15 NOTE — TOC Initial Note (Signed)
Transition of Care [facility]) - Initial/Assessment Note    Patient Details  Name: Luke Haynes MRN: [id number] Date of Birth: [birth year]  Transition of Care [facility]) CM/SW Contact:    Shelbie Ammons, RN Phone Number: 12/15/2019, 2:09 PM  Clinical Narrative:                  RNCM to bedside for assessment. Patient initially resting but easily arousable. Patient reports to feeling much better than he was when he got here. Discussed with patient that it has been recommended that he go to rehab for a short time to get stronger and continue with his rehabilitation. Patient verbalizes that he thinks this is a good idea. He reports he lives with his daughter in law, grandchildren and a friend and they provide transportation and that he does not want to be a burden on them. Patient came into hospital due to UTI/cystitis and reports this has been and ongoing problem for him.   FLS, PASSR, and bed search initiated.    Expected Discharge Plan: Skilled Nursing Facility Barriers to Discharge: Continued Medical Work up   Patient Goals and CMS Choice Patient states their goals for this hospitalization and ongoing recovery are:: to get strong enough to get back home and get this infection taken care of      Expected Discharge Plan and Services Expected Discharge Plan: [place]   Discharge Planning Services: CM Consult Post Acute Care Choice: [place] Living arrangements for the past 2 months: Single Family Home                                      Prior Living Arrangements/Services Living arrangements for the past 2 months: Single Family Home Lives with:: Adult Children, Friends Patient language and need for interpreter reviewed:: Yes        Need for Family Participation in Patient Care: Yes (Comment) Care giver support system in place?: Yes (comment)   Criminal Activity/Legal Involvement Pertinent to Current Situation/Hospitalization: No - Comment as  needed  Activities of Daily Living Home Assistive Devices/Equipment: Walker (specify type) ADL Screening (condition at time of admission) Patient's cognitive ability adequate to safely complete daily activities?: Yes Is the patient deaf or have difficulty hearing?: No Does the patient have difficulty seeing, even when wearing glasses/contacts?: No Does the patient have difficulty concentrating, remembering, or making decisions?: No Patient able to express need for assistance with ADLs?: Yes Does the patient have difficulty dressing or bathing?: Yes Independently performs ADLs?: No(has caretaker in home that helps him) Communication: Independent Dressing (OT): Needs assistance, Appropriate for developmental age Is this a change from baseline?: Pre-admission baseline Grooming: Appropriate for developmental age, Needs assistance Is this a change from baseline?: Pre-admission baseline Feeding: Independent Bathing: Needs assistance Is this a change from baseline?: Pre-admission baseline Toileting: Needs assistance Is this a change from baseline?: Pre-admission baseline In/Out Bed: Needs assistance Is this a change from baseline?: Pre-admission baseline Walks in Home: Needs assistance Is this a change from baseline?: Pre-admission baseline Does the patient have difficulty walking or climbing stairs?: Yes Weakness of Legs: Both Weakness of Arms/Hands: None  Permission Sought/Granted                  Emotional Assessment Appearance:: Appears stated age Attitude/Demeanor/Rapport: Engaged Affect (typically observed): Appropriate Orientation: : Oriented to Self, Oriented to Place, Oriented to  Time, Oriented to Situation  Psych Involvement: No (comment)  Admission diagnosis:  Sepsis secondary to UTI (HCC) [A41.9, N39.0] Cystitis [N30.90] Patient Active Problem List   Diagnosis Date Noted  . Bilateral ankle pain   . Sepsis secondary to UTI (HCC) 12/14/2019  . Acute lower UTI  12/07/2019  . AKI (acute kidney injury) (HCC) 12/07/2019  . Dehydration 12/07/2019  . Essential hypertension 12/07/2019  . Hyponatremia 12/07/2019  . Debility 12/07/2019  . Rhabdomyolysis 12/07/2019  . Elevated troponin 12/07/2019  . Sepsis (HCC) 12/07/2019   PCP:  Jaclyn Shaggy, MD Pharmacy:   [facility] [address], [place] - [address] [place] [address] Phone: [phone] Fax: [phone]     Social Determinants of Health (SDOH) Interventions    Readmission Risk Interventions No flowsheet data found.

## 2019-12-15 NOTE — Evaluation (Signed)
Physical Therapy Evaluation Patient Details Name: Luke Haynes MRN: [id number] DOB: 10-22-1941 Today's Date: 12/15/2019   History of Present Illness  Luke Haynes is a 78 y.o. male with medical history significant of hypertension, recurrent UTIs who was initially admitted and treated for UTI and associated E. coli bacteremia and was discharged on 12/11/2019.  On the previous presentation he endorsed laying in bed for 2 to 3 days prior to presenting to the ED urinating and stooling on himself.  At baseline he is able to ambulate.  After discharge she "did not feel right yet" and was not able to handle his activities of daily living at home.  He was discharged on p.o. cephalexin per culture sensitivities and did endorse adherence to the medication regimen however symptoms did not resolve and he presented back to the emergency department  Clinical Impression  Pt is a pleasant 78 year old male who was admitted for sepsis secondary to UTI. Pt with recent admissions for similar symptoms and reports he has become weaker, hardly able to get out of the bed. Pt performs bed mobility with min assist, transfers with mod assist from raised surface, and ambulation with min assist. Pt demonstrates deficits with endurance/LE strength/balance/mobility. Compared to previous evaluation at last admission, pt is currently not at baseline level. Would benefit from skilled PT to address above deficits and promote optimal return to PLOF; recommend transition to STR upon discharge from acute hospitalization.     Follow Up Recommendations SNF    Equipment Recommendations  Rolling walker with 5" wheels    Recommendations for Other Services       Precautions / Restrictions Precautions Precautions: Fall Restrictions Weight Bearing Restrictions: No      Mobility  Bed Mobility Overal bed mobility: Needs Assistance Bed Mobility: Supine to Sit;Sit to Supine     Supine to sit: Min guard Sit to supine: Min assist    General bed mobility comments: needs assist for coming to EOB. Once seated at EOB, able to maintain upright posture.  Transfers Overall transfer level: Needs assistance Equipment used: Rolling walker (2 wheeled) Transfers: Sit to/from Stand Sit to Stand: Mod assist;Max assist         General transfer comment: initially wasn't able to stand from low bed. Elevated bed and pt able to stand with min assist. Cues given for correct hand placement  Ambulation/Gait Ambulation/Gait assistance: Min assist Gait Distance (Feet): 3 Feet Assistive device: Rolling walker (2 wheeled);None Gait Pattern/deviations: Step-to pattern     General Gait Details: able to take several side steps at bedside with upright posture. Fatigues quickly and needs to sit back on bed.  Stairs            Wheelchair Mobility    Modified Rankin (Stroke Patients Only)       Balance Overall balance assessment: Needs assistance Sitting-balance support: No upper extremity supported;Feet supported Sitting balance-Leahy Scale: Fair     Standing balance support: Bilateral upper extremity supported;Single extremity supported;During functional activity Standing balance-Leahy Scale: Fair Standing balance comment: forward flexed posture                             Pertinent Vitals/Pain Pain Assessment: 0-10 Pain Score: 3  Pain Location: B plantar surface of feet Pain Descriptors / Indicators: Aching Pain Intervention(s): Limited activity within patient's tolerance;Repositioned    Home Living Family/patient expects to be discharged to:: Private residence Living Arrangements: Children(DIL (uses RW), her  husband, and 2 twins children) Available Help at Discharge: Family Type of Home: House Home Access: Stairs to enter Entrance Stairs-Rails: Can reach both Entrance Stairs-Number of Steps: 3-4 Home Layout: One level Home Equipment: None Additional Comments: was supposed to have RW delievered  after previous admission, however pt reports he didn't get it    Prior Function Level of Independence: Independent         Comments: Prior to recent admissions, at baseline pt is indep with mobility ADL. Frequent recent falls.     Hand Dominance   Dominant Hand: Right    Extremity/Trunk Assessment   Upper Extremity Assessment Upper Extremity Assessment: Overall WFL for tasks assessed    Lower Extremity Assessment Lower Extremity Assessment: Generalized weakness(B LE grossly 3/5; sore on bottoms of feet)    Cervical / Trunk Assessment Cervical / Trunk Assessment: Normal  Communication   Communication: No difficulties  Cognition Arousal/Alertness: Awake/alert Behavior During Therapy: WFL for tasks assessed/performed Overall Cognitive Status: Within Functional Limits for tasks assessed                                 General Comments: A&Ox4, follows commands      General Comments      Exercises Other Exercises Other Exercises: Pt instructed in RW mgt including hand placement, use during functional ADL transfers, and falls prevention strategies Other Exercises: supine ther-ex performed on B LE including AP, SLRs, quad sets, heel slides, and seated LAQ. ONce standing marching performed. All ther-ex performed x 10 reps with min assist. Fatigues quickly and needs rest break   Assessment/Plan    PT Assessment Patient needs continued PT services  PT Problem List Decreased strength;Decreased activity tolerance;Decreased balance;Decreased mobility       PT Treatment Interventions DME instruction;Gait training;Therapeutic exercise;Balance training    PT Goals (Current goals can be found in the Care Plan section)  Acute Rehab PT Goals Patient Stated Goal: get stronger PT Goal Formulation: With patient Time For Goal Achievement: 12/29/19 Potential to Achieve Goals: Good    Frequency Min 2X/week   Barriers to discharge        Co-evaluation                AM-PAC PT "6 Clicks" Mobility  Outcome Measure Help needed turning from your back to your side while in a flat bed without using bedrails?: A Little Help needed moving from lying on your back to sitting on the side of a flat bed without using bedrails?: A Little Help needed moving to and from a bed to a chair (including a wheelchair)?: A Lot Help needed standing up from a chair using your arms (e.g., wheelchair or bedside chair)?: A Lot Help needed to walk in hospital room?: A Lot Help needed climbing 3-5 steps with a railing? : Total 6 Click Score: 13    End of Session Equipment Utilized During Treatment: Gait belt Activity Tolerance: Patient tolerated treatment well Patient left: in bed;with bed alarm set Nurse Communication: Mobility status PT Visit Diagnosis: Muscle weakness (generalized) (M62.81);Difficulty in walking, not elsewhere classified (R26.2)    Time: 0272-5366 PT Time Calculation (min) (ACUTE ONLY): 23 min   Charges:   PT Evaluation $PT Eval Low Complexity: 1 Low PT Treatments $Therapeutic Exercise: 8-22 mins        Elizabeth Palau, PT, DPT [phone]   Kariyah Baugh 12/15/2019, 12:00 PM

## 2019-12-15 NOTE — Plan of Care (Signed)
Adult Care plan implemented.

## 2019-12-16 DIAGNOSIS — R531 Weakness: Secondary | ICD-10-CM

## 2019-12-16 DIAGNOSIS — N3090 Cystitis, unspecified without hematuria: Secondary | ICD-10-CM

## 2019-12-16 LAB — CBC
HCT: 32 % — ABNORMAL LOW (ref 39.0–52.0)
Hemoglobin: 10.4 g/dL — ABNORMAL LOW (ref 13.0–17.0)
MCH: 28.8 pg (ref 26.0–34.0)
MCHC: 32.5 g/dL (ref 30.0–36.0)
MCV: 88.6 fL (ref 80.0–100.0)
Platelets: 545 10*3/uL — ABNORMAL HIGH (ref 150–400)
RBC: 3.61 MIL/uL — ABNORMAL LOW (ref 4.22–5.81)
RDW: 12.1 % (ref 11.5–15.5)
WBC: 13.8 10*3/uL — ABNORMAL HIGH (ref 4.0–10.5)
nRBC: 0 % (ref 0.0–0.2)

## 2019-12-16 LAB — BASIC METABOLIC PANEL
Anion gap: 10 (ref 5–15)
BUN: 18 mg/dL (ref 8–23)
CO2: 27 mmol/L (ref 22–32)
Calcium: 8.4 mg/dL — ABNORMAL LOW (ref 8.9–10.3)
Chloride: 99 mmol/L (ref 98–111)
Creatinine, Ser: 0.82 mg/dL (ref 0.61–1.24)
GFR calc Af Amer: >60 mL/min (ref >60)
GFR calc non Af Amer: >60 mL/min (ref >60)
Glucose, Bld: 137 mg/dL — ABNORMAL HIGH (ref 70–99)
Potassium: 3.6 mmol/L (ref 3.5–5.1)
Sodium: 136 mmol/L (ref 135–145)

## 2019-12-16 MED ORDER — SODIUM CHLORIDE 0.9 % IV SOLN
INTRAVENOUS | Status: DC | PRN
  Administered 2019-12-16 – 2019-12-17 (×2): 250 mL via INTRAVENOUS

## 2019-12-16 MED ORDER — SODIUM CHLORIDE 0.9 % IV SOLN
2.0000 g | Freq: Three times a day (TID) | INTRAVENOUS | Status: DC
  Administered 2019-12-16 – 2019-12-18 (×7): 2 g via INTRAVENOUS
  Filled 2019-12-16 (×12): qty 2

## 2019-12-16 NOTE — Care Management Important Message (Signed)
Important Message  Patient Details  Name: Luke Haynes MRN: [id number] Date of Birth: [birth year]   Medicare Important Message Given:  Yes     Olegario Messier A Jaasiel [place] 12/16/2019, 11:00 AM

## 2019-12-16 NOTE — Progress Notes (Signed)
Physical Therapy Treatment Patient Details Name: Luke Haynes MRN: [id number] DOB: June 19, 1941 Today's Date: 12/16/2019    History of Present Illness Luke Haynes is a 79 y.o. male with medical history significant of hypertension, recurrent UTIs who was initially admitted and treated for UTI and associated E. coli bacteremia and was discharged on 12/11/2019.  On the previous presentation he endorsed laying in bed for 2 to 3 days prior to presenting to the ED urinating and stooling on himself.  At baseline he is able to ambulate.  After discharge she "did not feel right yet" and was not able to handle his activities of daily living at home.  He was discharged on p.o. cephalexin per culture sensitivities and did endorse adherence to the medication regimen however symptoms did not resolve and he presented back to the emergency department    PT Comments    Pt ready for session.  To edge of bed with min a x 1.  Once sitting generally steady.  Stood with verbal cues and min/mod a x 1 but remains leaning back on bed for support.  BM smear noted and care provided as appropriate.  On second stand he was able to take unsteady steps to recliner with close min a x 1.  Fatigued bu remained in recliner for ex as below.  Pt will need +2 assist for further gait for safety.   Follow Up Recommendations  SNF     Equipment Recommendations       Recommendations for Other Services       Precautions / Restrictions Precautions Precautions: Fall Restrictions Weight Bearing Restrictions: No    Mobility  Bed Mobility Overal bed mobility: Needs Assistance Bed Mobility: Supine to Sit;Sit to Supine     Supine to sit: Min assist;Mod assist        Transfers Overall transfer level: Needs assistance Equipment used: Rolling walker (2 wheeled) Transfers: Sit to/from Stand Sit to Stand: Mod assist         General transfer comment: multiple attempts  Ambulation/Gait Ambulation/Gait assistance: Mod  assist Gait Distance (Feet): 3 Feet Assistive device: Rolling walker (2 wheeled) Gait Pattern/deviations: Step-to pattern;Decreased step length - right;Decreased step length - left Gait velocity: decreased   General Gait Details: generally unsteady steps to recliner at bedside.  unable to progress gait due to safety and only +1 available at the time. will need +2 for further gait.   Stairs             Wheelchair Mobility    Modified Rankin (Stroke Patients Only)       Balance Overall balance assessment: Needs assistance Sitting-balance support: No upper extremity supported;Feet supported Sitting balance-Leahy Scale: Fair     Standing balance support: Bilateral upper extremity supported;Single extremity supported Standing balance-Leahy Scale: Poor Standing balance comment: generally unsteady today, relies on bed and walker for support for care due to BM smear.                            Cognition Arousal/Alertness: Awake/alert Behavior During Therapy: WFL for tasks assessed/performed Overall Cognitive Status: Within Functional Limits for tasks assessed                                        Exercises Other Exercises Other Exercises: seated ankle pumps, LAQ, ab/add and marches x 10  General Comments        Pertinent Vitals/Pain Pain Assessment: No/denies pain    Home Living                      Prior Function            PT Goals (current goals can now be found in the care plan section) Progress towards PT goals: Progressing toward goals    Frequency    Min 2X/week      PT Plan Current plan remains appropriate    Co-evaluation              AM-PAC PT "6 Clicks" Mobility   Outcome Measure  Help needed turning from your back to your side while in a flat bed without using bedrails?: A Little Help needed moving from lying on your back to sitting on the side of a flat bed without using bedrails?: A  Little Help needed moving to and from a bed to a chair (including a wheelchair)?: A Lot Help needed standing up from a chair using your arms (e.g., wheelchair or bedside chair)?: A Lot Help needed to walk in hospital room?: A Lot Help needed climbing 3-5 steps with a railing? : Total 6 Click Score: 13    End of Session Equipment Utilized During Treatment: Gait belt Activity Tolerance: Patient tolerated treatment well;Patient limited by fatigue Patient left: in chair;with call bell/phone within reach;with chair alarm set Nurse Communication: Mobility status       Time: 0086-7619 PT Time Calculation (min) (ACUTE ONLY): 12 min  Charges:  $Therapeutic Exercise: 8-22 mins                    Chesley Noon, PTA 12/16/19, 12:15 PM

## 2019-12-16 NOTE — Progress Notes (Signed)
PROGRESS NOTE    Luke Haynes  OZH:[id number] DOB: 05/28/1941 DOA: 12/14/2019 PCP: Jaclyn Shaggyate, Denny C, MD      Assessment & Plan:   Active Problems:   Sepsis secondary to UTI (HCC)   Bilateral ankle pain  Sepsis: secondary to UTI. Continue on IV cefepime. Blood cx NGTD.   UTI: UA shows trace leukocytosis, urine cx shows no growth. Continue on IV cefepime x3 days more. Pt is unsure of how many doses of abxs he took while he was at home   B/l ankle pain: pt denies any recent falls or trauma. XR b/l ankle shows mild diffuse subcutaneous soft tissue edema. No fracture, bone lesion or ankle joint abnormality  Leukocytosis: secondary to infection. Continue on IV abxs  Thrombocytosis: likely reactive. Will continue to monitor   Normocytic anemia: no need for a transfusion at this time. Will continue to monitor   Generalized weakness: PT/OT recommends SNF. Pt is agreeable to SNF  DVT prophylaxis: lovenox Code Status: full  Family Communication:  Disposition Plan:    Consultants:   n/a   Procedures: n/a  Antimicrobials: cefepime   Subjective: Pt c/o generalized weakness  Objective: Vitals:   12/15/19 0834 12/15/19 1045 12/15/19 1624 12/15/19 2307  BP: (!) 152/80  137/73 (!) 151/77  Pulse: 81 98 83 76  Resp: 18  14 16   Temp: 98.1 F (36.7 C)  99.6 F (37.6 C) 97.8 F (36.6 C)  TempSrc: Oral  Oral Oral  SpO2: 99% 98% 94% 100%  Weight:      Height:        Intake/Output Summary (Last 24 hours) at 12/16/2019 0743 Last data filed at 12/16/2019 0500 Gross per 24 hour  Intake 120 ml  Output 1950 ml  Net -1830 ml   Filed Weights   12/14/19 1301 12/14/19 2359  Weight: 90 kg 96.2 kg    Examination:  General exam: Appears calm and comfortable   Respiratory system: decreased breath sounds b/l. No rales  Cardiovascular system: S1 & S2 +. No  rubs, gallops or clicks.  Gastrointestinal system: Abdomen is nondistended, soft and nontender. Normal bowel sounds  heard. Central nervous system: Alert and oriented. Moves all 4 extremities Psychiatry: Judgement and insight appear normal. Flat mood and affect.     Data Reviewed: I have personally reviewed following labs and imaging studies  CBC: Recent Labs  Lab 12/10/19 0755 12/11/19 0524 12/14/19 1301 12/15/19 0432 12/16/19 0616  WBC 16.4* 14.2* 20.9* 13.5* 13.8*  NEUTROABS  --   --  16.9*  --   --   HGB 11.2* 11.0* 13.2 10.9* 10.4*  HCT 33.9* 33.8* 40.2 31.9* 32.0*  MCV 88.3 89.2 89.1 85.3 88.6  PLT 281 330 599* 492* 545*   Basic Metabolic Panel: Recent Labs  Lab 12/10/19 0755 12/14/19 1301 12/15/19 0432 12/16/19 0616  NA 137 134* 138 136  K 3.8 3.9 3.7 3.6  CL 101 96* 102 99  CO2 28 26 28 27   GLUCOSE 170* 193* 142* 137*  BUN 35* 17 20 18   CREATININE 1.20 0.99 0.89 0.82  CALCIUM 8.2* 8.8* 8.3* 8.4*   GFR: Estimated Creatinine Clearance: 85 mL/min (by C-G formula based on SCr of 0.82 mg/dL). Liver Function Tests: Recent Labs  Lab 12/14/19 1301  AST 44*  ALT 42  ALKPHOS 177*  BILITOT 1.0  PROT 7.7  ALBUMIN 3.0*   No results for input(s): LIPASE, AMYLASE in the last 168 hours. No results for input(s): AMMONIA in the last 168  hours. Coagulation Profile: Recent Labs  Lab 12/14/19 1301 12/15/19 0432  INR 0.9 1.0   Cardiac Enzymes: No results for input(s): CKTOTAL, CKMB, CKMBINDEX, TROPONINI in the last 168 hours. BNP (last 3 results) No results for input(s): PROBNP in the last 8760 hours. HbA1C: Recent Labs    12/15/19 0432  HGBA1C 6.3*   CBG: No results for input(s): GLUCAP in the last 168 hours. Lipid Profile: No results for input(s): CHOL, HDL, LDLCALC, TRIG, CHOLHDL, LDLDIRECT in the last 72 hours. Thyroid Function Tests: No results for input(s): TSH, T4TOTAL, FREET4, T3FREE, THYROIDAB in the last 72 hours. Anemia Panel: No results for input(s): VITAMINB12, FOLATE, FERRITIN, TIBC, IRON, RETICCTPCT in the last 72 hours. Sepsis Labs: Recent Labs  Lab  12/14/19 1301 12/15/19 0432  PROCALCITON  --  0.11  LATICACIDVEN 1.9  --     Recent Results (from the past 240 hour(s))  Blood culture (routine single)     Status: Abnormal   Collection Time: 12/07/19  4:30 PM   Specimen: BLOOD  Result Value Ref Range Status   Specimen Description   Final    BLOOD LEFT ANTECUBITAL Performed at [facility], [address] [place], [place] [address]    Special Requests   Final    BOTTLES DRAWN AEROBIC AND ANAEROBIC Blood Culture adequate volume Performed at [facility], [address] [place], [place] [address]    Culture  Setup Time   Final    GRAM NEGATIVE RODS AEROBIC BOTTLE ONLY CRITICAL RESULT CALLED TO, READ BACK BY AND VERIFIED WITH: Lajuan Linesnne Pride @ 1934 on 12/11/2019 by caf Performed at [facility], [address] [place], [place] [address]    Culture (A)  Final    ESCHERICHIA COLI SUSCEPTIBILITIES PERFORMED ON PREVIOUS CULTURE WITHIN THE LAST 5 DAYS. Performed at [facility], [address] [place], [place] [address]    Report Status 12/14/2019 FINAL  Final  Urine culture     Status: Abnormal   Collection Time: 12/07/19  5:07 PM   Specimen: In/Out Cath Urine  Result Value Ref Range Status   Specimen Description   Final    IN/OUT CATH URINE Performed at [facility], [address] [place], [place] [address]    Special Requests   Final    NONE Performed at [facility], [address] [place], [place] [address]    Culture >=100,000 COLONIES/mL ESCHERICHIA COLI (A)  Final   Report Status 12/10/2019 FINAL  Final   Organism ID, Bacteria ESCHERICHIA COLI (A)  Final      Susceptibility   Escherichia coli - MIC*    AMPICILLIN >=32 RESISTANT Resistant     CEFAZOLIN <=4 SENSITIVE Sensitive     CEFTRIAXONE <=1 SENSITIVE Sensitive     CIPROFLOXACIN <=0.25 SENSITIVE Sensitive     GENTAMICIN <=1 SENSITIVE Sensitive     IMIPENEM <=0.25 SENSITIVE Sensitive      NITROFURANTOIN <=16 SENSITIVE Sensitive     TRIMETH/SULFA <=20 SENSITIVE Sensitive     AMPICILLIN/SULBACTAM >=32 RESISTANT Resistant     PIP/TAZO <=4 SENSITIVE Sensitive     * >=100,000 COLONIES/mL ESCHERICHIA COLI  Blood culture (single)     Status: Abnormal   Collection Time: 12/07/19  6:22 PM   Specimen: BLOOD  Result Value Ref Range Status   Specimen Description   Final    BLOOD BLOOD LEFT ARM Performed at [facility], [address] [place], [place] [address]    Special Requests  Final    BOTTLES DRAWN AEROBIC AND ANAEROBIC Blood Culture adequate volume Performed at [facility], [address] [place], [place] [address]    Culture  Setup Time   Final    GRAM NEGATIVE RODS AEROBIC BOTTLE ONLY CRITICAL RESULT CALLED TO, READ BACK BY AND VERIFIED WITH: SCOTT HALL 12/10/2019 AT 0405 HS Performed at [facility], [place] [address] [place], [place] [address]    Culture ESCHERICHIA COLI (A)  Final   Report Status 12/12/2019 FINAL  Final   Organism ID, Bacteria ESCHERICHIA COLI  Final      Susceptibility   Escherichia coli - MIC*    AMPICILLIN >=32 RESISTANT Resistant     CEFAZOLIN <=4 SENSITIVE Sensitive     CEFEPIME <=1 SENSITIVE Sensitive     CEFTAZIDIME <=1 SENSITIVE Sensitive     CEFTRIAXONE <=1 SENSITIVE Sensitive     CIPROFLOXACIN <=0.25 SENSITIVE Sensitive     GENTAMICIN <=1 SENSITIVE Sensitive     IMIPENEM <=0.25 SENSITIVE Sensitive     TRIMETH/SULFA <=20 SENSITIVE Sensitive     AMPICILLIN/SULBACTAM 16 INTERMEDIATE Intermediate     PIP/TAZO <=4 SENSITIVE Sensitive     * ESCHERICHIA COLI  SARS CORONAVIRUS 2 (TAT 6-24 HRS) Nasopharyngeal Nasopharyngeal Swab     Status: None   Collection Time: 12/07/19  6:22 PM   Specimen: Nasopharyngeal Swab  Result Value Ref Range Status   SARS Coronavirus 2 NEGATIVE NEGATIVE Final    Comment: (NOTE) SARS-CoV-2 target nucleic acids are NOT DETECTED. The SARS-CoV-2 RNA is generally detectable in upper  and lower respiratory specimens during the acute phase of infection. Negative results do not preclude SARS-CoV-2 infection, do not rule out co-infections with other pathogens, and should not be used as the sole basis for treatment or other patient management decisions. Negative results must be combined with clinical observations, patient history, and epidemiological information. The expected result is Negative. Fact Sheet for Patients: SugarRoll.be Fact Sheet for Healthcare Providers: https://www.woods-mathews.com/ This test is not yet approved or cleared by the [place] FDA and  has been authorized for detection and/or diagnosis of SARS-CoV-2 by FDA under an Emergency Use Authorization (EUA). This EUA will remain  in effect (meaning this test can be used) for the duration of the COVID-19 declaration under Section 56 4(b)(1) of the Act, 21 U.S.C. section 360bbb-3(b)(1), unless the authorization is terminated or revoked sooner. Performed at [facility], [place] [address] [place], [place] [address]   Blood Culture ID Panel (Reflexed)     Status: Abnormal   Collection Time: 12/07/19  6:22 PM  Result Value Ref Range Status   Enterococcus species NOT DETECTED NOT DETECTED Final   Listeria monocytogenes NOT DETECTED NOT DETECTED Final   Staphylococcus species NOT DETECTED NOT DETECTED Final   Staphylococcus aureus (BCID) NOT DETECTED NOT DETECTED Final   Streptococcus species NOT DETECTED NOT DETECTED Final   Streptococcus agalactiae NOT DETECTED NOT DETECTED Final   Streptococcus pneumoniae NOT DETECTED NOT DETECTED Final   Streptococcus pyogenes NOT DETECTED NOT DETECTED Final   Acinetobacter baumannii NOT DETECTED NOT DETECTED Final   Enterobacteriaceae species DETECTED (A) NOT DETECTED Final    Comment: Enterobacteriaceae represent a large family of gram-negative bacteria, not a single organism. CRITICAL RESULT CALLED TO, READ BACK  BY AND VERIFIED WITH: SCOTT HALL 12/10/2019 AT 0405 HS    Enterobacter cloacae complex NOT DETECTED NOT DETECTED Final   Escherichia coli DETECTED (A) NOT DETECTED Final    Comment: CRITICAL RESULT  CALLED TO, READ BACK BY AND VERIFIED WITH: SCOTT HALL 12/10/2019 AT 0405 HS    Klebsiella oxytoca NOT DETECTED NOT DETECTED Final   Klebsiella pneumoniae NOT DETECTED NOT DETECTED Final   Proteus species NOT DETECTED NOT DETECTED Final   Serratia marcescens NOT DETECTED NOT DETECTED Final   Carbapenem resistance NOT DETECTED NOT DETECTED Final   Haemophilus influenzae NOT DETECTED NOT DETECTED Final   Neisseria meningitidis NOT DETECTED NOT DETECTED Final   Pseudomonas aeruginosa NOT DETECTED NOT DETECTED Final   Candida albicans NOT DETECTED NOT DETECTED Final   Candida glabrata NOT DETECTED NOT DETECTED Final   Candida krusei NOT DETECTED NOT DETECTED Final   Candida parapsilosis NOT DETECTED NOT DETECTED Final   Candida tropicalis NOT DETECTED NOT DETECTED Final    Comment: Performed at [facility], [address] [place], [place] [address]  Blood Culture (routine x 2)     Status: None (Preliminary result)   Collection Time: 12/14/19  1:01 PM   Specimen: BLOOD  Result Value Ref Range Status   Specimen Description BLOOD BLOOD LEFT WRIST  Final   Special Requests   Final    BOTTLES DRAWN AEROBIC AND ANAEROBIC Blood Culture results [place] not be optimal due to an inadequate volume of blood received in culture bottles   Culture   Final    NO GROWTH < 24 HOURS Performed at [facility], [address] [place], [place] [address]    Report Status PENDING  Incomplete  Urine culture     Status: None   Collection Time: 12/14/19  1:01 PM   Specimen: Urine, Random  Result Value Ref Range Status   Specimen Description   Final    URINE, RANDOM Performed at [facility], [address] [place], [place] [address]    Special Requests   Final    NONE Performed at  [facility], [address], [place], [place] [address]    Culture   Final    NO GROWTH Performed at [facility], [address] [place], [place] [address]    Report Status 12/15/2019 FINAL  Final  Respiratory Panel by RT PCR (Flu A&B, Covid) - Nasopharyngeal Swab     Status: None   Collection Time: 12/14/19  1:02 PM   Specimen: Nasopharyngeal Swab  Result Value Ref Range Status   SARS Coronavirus 2 by RT PCR NEGATIVE NEGATIVE Final    Comment: (NOTE) SARS-CoV-2 target nucleic acids are NOT DETECTED. The SARS-CoV-2 RNA is generally detectable in upper respiratoy specimens during the acute phase of infection. The lowest concentration of SARS-CoV-2 viral copies this assay can detect is 131 copies/mL. A negative result does not preclude SARS-Cov-2 infection and should not be used as the sole basis for treatment or other patient management decisions. A negative result Noffsinger occur with  improper specimen collection/handling, submission of specimen other than nasopharyngeal swab, presence of viral mutation(s) within the areas targeted by this assay, and inadequate number of viral copies (<131 copies/mL). A negative result must be combined with clinical observations, patient history, and epidemiological information. The expected result is Negative. Fact Sheet for Patients:  https://www.moore.com/ Fact Sheet for Healthcare Providers:  https://www.young.biz/ This test is not yet ap proved or cleared by the [place] FDA and  has been authorized for detection and/or diagnosis of SARS-CoV-2 by FDA under an Emergency Use Authorization (EUA). This EUA will remain  in effect (meaning this test can be used) for the duration of  the COVID-19 declaration under Section 564(b)(1) of the Act, 21 U.S.C. section 360bbb-3(b)(1), unless the authorization is terminated or revoked sooner.    Influenza A by PCR NEGATIVE NEGATIVE Final   Influenza  B by PCR NEGATIVE NEGATIVE Final    Comment: (NOTE) The Xpert Xpress SARS-CoV-2/FLU/RSV assay is intended as an aid in  the diagnosis of influenza from Nasopharyngeal swab specimens and  should not be used as a sole basis for treatment. Nasal washings and  aspirates are unacceptable for Xpert Xpress SARS-CoV-2/FLU/RSV  testing. Fact Sheet for Patients: https://www.moore.com/ Fact Sheet for Healthcare Providers: https://www.young.biz/ This test is not yet approved or cleared by the [place] FDA and  has been authorized for detection and/or diagnosis of SARS-CoV-2 by  FDA under an Emergency Use Authorization (EUA). This EUA will remain  in effect (meaning this test can be used) for the duration of the  Covid-19 declaration under Section 564(b)(1) of the Act, 21  U.S.C. section 360bbb-3(b)(1), unless the authorization is  terminated or revoked. Performed at [facility], [address] [place], [place] [address]   Blood Culture (routine x 2)     Status: None (Preliminary result)   Collection Time: 12/14/19  1:06 PM   Specimen: BLOOD  Result Value Ref Range Status   Specimen Description BLOOD RIGHT ANTECUBITAL  Final   Special Requests   Final    BOTTLES DRAWN AEROBIC AND ANAEROBIC Blood Culture adequate volume   Culture   Final    NO GROWTH < 24 HOURS Performed at [facility], [address], [place], [place] [address]    Report Status PENDING  Incomplete         Radiology Studies: DG Ankle 2 Views Left  Result Date: 12/15/2019 CLINICAL DATA:  Bilateral ankle pain. EXAM: LEFT ANKLE - 2 VIEW COMPARISON:  None. FINDINGS: No fracture or bone lesion. The ankle joint is normally spaced and aligned. No significant arthropathic change. There is mild diffuse surrounding subcutaneous soft tissue edema. IMPRESSION: 1. No fracture, bone lesion or ankle joint abnormality. Electronically Signed   By: Amie Portland M.D.   On:  12/15/2019 13:41   DG Ankle 2 Views Right  Result Date: 12/15/2019 CLINICAL DATA:  Bilateral ankle pain. EXAM: RIGHT ANKLE - 2 VIEW COMPARISON:  None. FINDINGS: No fracture or bone lesion. Ankle joint normally spaced and aligned. No significant arthropathic change. There is mild diffuse subcutaneous soft tissue edema. IMPRESSION: No fracture, bone lesion or ankle joint abnormality. Electronically Signed   By: Amie Portland M.D.   On: 12/15/2019 13:42   DG Chest Port 1 View  Result Date: 12/14/2019 CLINICAL DATA:  Fever today.  Patient recently diagnosed with a UTI. EXAM: PORTABLE CHEST 1 VIEW COMPARISON:  Single-view of the chest 12/07/2019. FINDINGS: Lungs clear. Heart size normal. No pneumothorax or pleural fluid. No bony abnormality. IMPRESSION: Negative chest. Electronically Signed   By: Drusilla Kanner M.D.   On: 12/14/2019 13:15   CT Renal Stone Study  Result Date: 12/14/2019 CLINICAL DATA:  Hematuria EXAM: CT ABDOMEN AND PELVIS WITHOUT CONTRAST TECHNIQUE: Multidetector CT imaging of the abdomen and pelvis was performed following the standard protocol without IV contrast. COMPARISON:  None. FINDINGS: Lower chest: Small right pleural effusion. Trace left pleural effusion. Mild bibasilar atelectasis. Hepatobiliary: No focal liver abnormality is seen. No gallstones, gallbladder wall thickening, or biliary dilatation. Pancreas: Unremarkable. Spleen: Unremarkable Adrenals/Urinary Tract: Adrenals are unremarkable. Small bilateral renal cysts. Mild perinephric stranding of which could be chronic. Limited  evaluation for pyelonephritis on this study. There is no hydronephrosis. No calculi identified. Bladder is unremarkable. Stomach/Bowel: Few colonic diverticula.  Normal appendix. Vascular/Lymphatic: Aortic atherosclerosis. No enlarged abdominal or pelvic lymph nodes. Reproductive: Prostate is unremarkable. Other: Mild mesenteric infiltration. Right inguinal hernia containing distal ileum. Left  inguinal hernia containing sigmoid. Musculoskeletal: Degenerative changes of the included spine. IMPRESSION: No urinary tract calculi or hydronephrosis. Bilateral bowel containing inguinal hernias. Small pleural effusions and bibasilar atelectasis. Electronically Signed   By: Guadlupe Spanish M.D.   On: 12/14/2019 15:24        Scheduled Meds: . ALPRAZolam  0.5 mg Oral TID  . amLODipine  10 mg Oral Daily  . enalapril  5 mg Oral Daily  . enoxaparin (LOVENOX) injection  40 mg Subcutaneous Q24H  . feeding supplement (ENSURE ENLIVE)  237 mL Oral BID BM  . folic acid  1 mg Oral Daily  . multivitamin with minerals  1 tablet Oral Daily  . nebivolol  10 mg Oral Daily  . tamsulosin  0.4 mg Oral Daily  . thiamine  100 mg Oral Daily   Continuous Infusions: . ceFEPime (MAXIPIME) IV 2 g (12/16/19 0259)     LOS: 2 days    Time spent: 30 mins     Charise Killian, MD [facility] Pager [phone]  If 7PM-7AM, please contact night-coverage [email or web address] Password [facility] 12/16/2019, 7:43 AM

## 2019-12-17 LAB — CBC
HCT: 30.8 % — ABNORMAL LOW (ref 39.0–52.0)
Hemoglobin: 10.5 g/dL — ABNORMAL LOW (ref 13.0–17.0)
MCH: 28.9 pg (ref 26.0–34.0)
MCHC: 34.1 g/dL (ref 30.0–36.0)
MCV: 84.8 fL (ref 80.0–100.0)
Platelets: 566 10*3/uL — ABNORMAL HIGH (ref 150–400)
RBC: 3.63 MIL/uL — ABNORMAL LOW (ref 4.22–5.81)
RDW: 12.1 % (ref 11.5–15.5)
WBC: 14.6 10*3/uL — ABNORMAL HIGH (ref 4.0–10.5)
nRBC: 0 % (ref 0.0–0.2)

## 2019-12-17 LAB — COMPREHENSIVE METABOLIC PANEL
ALT: 33 U/L (ref 0–44)
AST: 32 U/L (ref 15–41)
Albumin: 2.4 g/dL — ABNORMAL LOW (ref 3.5–5.0)
Alkaline Phosphatase: 121 U/L (ref 38–126)
Anion gap: 12 (ref 5–15)
BUN: 21 mg/dL (ref 8–23)
CO2: 27 mmol/L (ref 22–32)
Calcium: 8.9 mg/dL (ref 8.9–10.3)
Chloride: 97 mmol/L — ABNORMAL LOW (ref 98–111)
Creatinine, Ser: 0.83 mg/dL (ref 0.61–1.24)
GFR calc Af Amer: >60 mL/min (ref >60)
GFR calc non Af Amer: >60 mL/min (ref >60)
Glucose, Bld: 134 mg/dL — ABNORMAL HIGH (ref 70–99)
Potassium: 3.5 mmol/L (ref 3.5–5.1)
Sodium: 136 mmol/L (ref 135–145)
Total Bilirubin: 0.6 mg/dL (ref 0.3–1.2)
Total Protein: 6.6 g/dL (ref 6.5–8.1)

## 2019-12-17 MED ORDER — FUROSEMIDE 10 MG/ML IJ SOLN
40.0000 mg | Freq: Once | INTRAMUSCULAR | Status: AC
  Administered 2019-12-17: 40 mg via INTRAVENOUS
  Filled 2019-12-17: qty 4

## 2019-12-17 NOTE — Progress Notes (Signed)
PROGRESS NOTE    Luke Haynes  TMA:[id number] DOB: May 25, 1941 DOA: 12/14/2019 PCP: Jaclyn Shaggy, MD      Assessment & Plan:   Active Problems:   Sepsis secondary to UTI (HCC)   Bilateral ankle pain   Cystitis  Sepsis: secondary to UTI. Continue on IV cefepime. Blood cx NGTD. Resolved  UTI: UA shows trace leukocytosis, urine cx shows no growth. Continue on IV cefepime x2 days more. Pt is unsure of how many doses of abxs he took while he was at home   B/l ankle pain & swelling: pt denies any recent falls or trauma. XR b/l ankle shows mild diffuse subcutaneous soft tissue edema. No fracture, bone lesion or ankle joint abnormality. Will give lasix x 1 today.   Leukocytosis: secondary to infection. Continue on IV abxs  Thrombocytosis: likely reactive. Will continue to monitor   Normocytic anemia: H&H are stable. No need for a transfusion at this time. Will continue to monitor   Generalized weakness: PT/OT recommends SNF. Pt is agreeable to SNF  DVT prophylaxis: lovenox Code Status: full  Family Communication:  Disposition Plan: pt is stable for d/c to SNF. Awaiting to hear back from CM regarding SNF placement    Consultants:   n/a   Procedures: n/a  Antimicrobials: cefepime   Subjective: Pt c/o malaise  Objective: Vitals:   12/16/19 0923 12/16/19 1123 12/16/19 1610 12/17/19 0034  BP: (!) 153/80 137/81 (!) 142/84 (!) 145/73  Pulse: 74 89 89 73  Resp: 17  17 17   Temp: 98.4 F (36.9 C)  98.7 F (37.1 C) 98.2 F (36.8 C)  TempSrc: Oral     SpO2: 96%  98% 95%  Weight:      Height:        Intake/Output Summary (Last 24 hours) at 12/17/2019 0740 Last data filed at 12/17/2019 02/14/2020 Gross per 24 hour  Intake 112.62 ml  Output 1625 ml  Net -1512.38 ml   Filed Weights   12/14/19 1301 12/14/19 2359  Weight: 90 kg 96.2 kg    Examination:  General exam: Appears calm and comfortable   Respiratory system: diminished breath sounds b/l. No rales or rhonchi    Cardiovascular system: S1 & S2 +. No  rubs, gallops or clicks.  Gastrointestinal system: Abdomen is nondistended, soft and nontender. Normal bowel sounds heard. Central nervous system: Alert and oriented. Moves all 4 extremities Psychiatry: Judgement and insight appear normal. Flat mood and affect.     Data Reviewed: I have personally reviewed following labs and imaging studies  CBC: Recent Labs  Lab 12/11/19 0524 12/14/19 1301 12/15/19 0432 12/16/19 0616 12/17/19 0515  WBC 14.2* 20.9* 13.5* 13.8* 14.6*  NEUTROABS  --  16.9*  --   --   --   HGB 11.0* 13.2 10.9* 10.4* 10.5*  HCT 33.8* 40.2 31.9* 32.0* 30.8*  MCV 89.2 89.1 85.3 88.6 84.8  PLT 330 599* 492* 545* 566*   Basic Metabolic Panel: Recent Labs  Lab 12/10/19 0755 12/14/19 1301 12/15/19 0432 12/16/19 0616 12/17/19 0515  NA 137 134* 138 136 136  K 3.8 3.9 3.7 3.6 3.5  CL 101 96* 102 99 97*  CO2 28 26 28 27 27   GLUCOSE 170* 193* 142* 137* 134*  BUN 35* 17 20 18 21   CREATININE 1.20 0.99 0.89 0.82 0.83  CALCIUM 8.2* 8.8* 8.3* 8.4* 8.9   GFR: Estimated Creatinine Clearance: 83.9 mL/min (by C-G formula based on SCr of 0.83 mg/dL). Liver Function Tests: Recent Labs  Lab 12/14/19 1301 12/17/19 0515  AST 44* 32  ALT 42 33  ALKPHOS 177* 121  BILITOT 1.0 0.6  PROT 7.7 6.6  ALBUMIN 3.0* 2.4*   No results for input(s): LIPASE, AMYLASE in the last 168 hours. No results for input(s): AMMONIA in the last 168 hours. Coagulation Profile: Recent Labs  Lab 12/14/19 1301 12/15/19 0432  INR 0.9 1.0   Cardiac Enzymes: No results for input(s): CKTOTAL, CKMB, CKMBINDEX, TROPONINI in the last 168 hours. BNP (last 3 results) No results for input(s): PROBNP in the last 8760 hours. HbA1C: Recent Labs    12/15/19 0432  HGBA1C 6.3*   CBG: No results for input(s): GLUCAP in the last 168 hours. Lipid Profile: No results for input(s): CHOL, HDL, LDLCALC, TRIG, CHOLHDL, LDLDIRECT in the last 72 hours. Thyroid  Function Tests: No results for input(s): TSH, T4TOTAL, FREET4, T3FREE, THYROIDAB in the last 72 hours. Anemia Panel: No results for input(s): VITAMINB12, FOLATE, FERRITIN, TIBC, IRON, RETICCTPCT in the last 72 hours. Sepsis Labs: Recent Labs  Lab 12/14/19 1301 12/15/19 0432  PROCALCITON  --  0.11  LATICACIDVEN 1.9  --     Recent Results (from the past 240 hour(s))  Blood culture (routine single)     Status: Abnormal   Collection Time: 12/07/19  4:30 PM   Specimen: BLOOD  Result Value Ref Range Status   Specimen Description   Final    BLOOD LEFT ANTECUBITAL Performed at [facility], [address] [place], [place] [address]    Special Requests   Final    BOTTLES DRAWN AEROBIC AND ANAEROBIC Blood Culture adequate volume Performed at [facility], [address] [place], [place] [address]    Culture  Setup Time   Final    GRAM NEGATIVE RODS AEROBIC BOTTLE ONLY CRITICAL RESULT CALLED TO, READ BACK BY AND VERIFIED WITH: Lenda Kelp @ 1934 on 12/11/2019 by caf Performed at [facility], [address] [place], [place] [address]    Culture (A)  Final    ESCHERICHIA COLI SUSCEPTIBILITIES PERFORMED ON PREVIOUS CULTURE WITHIN THE LAST 5 DAYS. Performed at [facility], [place] [address], [place], [place] [address]    Report Status 12/14/2019 FINAL  Final  Urine culture     Status: Abnormal   Collection Time: 12/07/19  5:07 PM   Specimen: In/Out Cath Urine  Result Value Ref Range Status   Specimen Description   Final    IN/OUT CATH URINE Performed at [facility], [address] [place], [place] [address]    Special Requests   Final    NONE Performed at [facility], [address] [place], [place] [address]    Culture >=100,000 COLONIES/mL ESCHERICHIA COLI (A)  Final   Report Status 12/10/2019 FINAL  Final   Organism ID, Bacteria ESCHERICHIA COLI (A)  Final      Susceptibility   Escherichia coli - MIC*     AMPICILLIN >=32 RESISTANT Resistant     CEFAZOLIN <=4 SENSITIVE Sensitive     CEFTRIAXONE <=1 SENSITIVE Sensitive     CIPROFLOXACIN <=0.25 SENSITIVE Sensitive     GENTAMICIN <=1 SENSITIVE Sensitive     IMIPENEM <=0.25 SENSITIVE Sensitive     NITROFURANTOIN <=16 SENSITIVE Sensitive     TRIMETH/SULFA <=20 SENSITIVE Sensitive     AMPICILLIN/SULBACTAM >=32 RESISTANT Resistant     PIP/TAZO <=4 SENSITIVE Sensitive     * >=100,000 COLONIES/mL ESCHERICHIA COLI  Blood culture (single)  Status: Abnormal   Collection Time: 12/07/19  6:22 PM   Specimen: BLOOD  Result Value Ref Range Status   Specimen Description   Final    BLOOD BLOOD LEFT ARM Performed at [facility], [address] [place], [place] [address]    Special Requests   Final    BOTTLES DRAWN AEROBIC AND ANAEROBIC Blood Culture adequate volume Performed at [facility], [address] [place], [place] [address]    Culture  Setup Time   Final    GRAM NEGATIVE RODS AEROBIC BOTTLE ONLY CRITICAL RESULT CALLED TO, READ BACK BY AND VERIFIED WITH: SCOTT HALL 12/10/2019 AT 0405 HS Performed at [facility], [address] [place], [place] [address]    Culture ESCHERICHIA COLI (A)  Final   Report Status 12/12/2019 FINAL  Final   Organism ID, Bacteria ESCHERICHIA COLI  Final      Susceptibility   Escherichia coli - MIC*    AMPICILLIN >=32 RESISTANT Resistant     CEFAZOLIN <=4 SENSITIVE Sensitive     CEFEPIME <=1 SENSITIVE Sensitive     CEFTAZIDIME <=1 SENSITIVE Sensitive     CEFTRIAXONE <=1 SENSITIVE Sensitive     CIPROFLOXACIN <=0.25 SENSITIVE Sensitive     GENTAMICIN <=1 SENSITIVE Sensitive     IMIPENEM <=0.25 SENSITIVE Sensitive     TRIMETH/SULFA <=20 SENSITIVE Sensitive     AMPICILLIN/SULBACTAM 16 INTERMEDIATE Intermediate     PIP/TAZO <=4 SENSITIVE Sensitive     * ESCHERICHIA COLI  SARS CORONAVIRUS 2 (TAT 6-24 HRS) Nasopharyngeal Nasopharyngeal Swab     Status: None   Collection Time:  12/07/19  6:22 PM   Specimen: Nasopharyngeal Swab  Result Value Ref Range Status   SARS Coronavirus 2 NEGATIVE NEGATIVE Final    Comment: (NOTE) SARS-CoV-2 target nucleic acids are NOT DETECTED. The SARS-CoV-2 RNA is generally detectable in upper and lower respiratory specimens during the acute phase of infection. Negative results do not preclude SARS-CoV-2 infection, do not rule out co-infections with other pathogens, and should not be used as the sole basis for treatment or other patient management decisions. Negative results must be combined with clinical observations, patient history, and epidemiological information. The expected result is Negative. Fact Sheet for Patients: HairSlick.nohttps://www.fda.gov/media/138098/download Fact Sheet for Healthcare Providers: quierodirigir.comhttps://www.fda.gov/media/138095/download This test is not yet approved or cleared by the [place] FDA and  has been authorized for detection and/or diagnosis of SARS-CoV-2 by FDA under an Emergency Use Authorization (EUA). This EUA will remain  in effect (meaning this test can be used) for the duration of the COVID-19 declaration under Section 56 4(b)(1) of the Act, 21 U.S.C. section 360bbb-3(b)(1), unless the authorization is terminated or revoked sooner. Performed at [facility], [address] [place], [place] [address]   Blood Culture ID Panel (Reflexed)     Status: Abnormal   Collection Time: 12/07/19  6:22 PM  Result Value Ref Range Status   Enterococcus species NOT DETECTED NOT DETECTED Final   Listeria monocytogenes NOT DETECTED NOT DETECTED Final   Staphylococcus species NOT DETECTED NOT DETECTED Final   Staphylococcus aureus (BCID) NOT DETECTED NOT DETECTED Final   Streptococcus species NOT DETECTED NOT DETECTED Final   Streptococcus agalactiae NOT DETECTED NOT DETECTED Final   Streptococcus pneumoniae NOT DETECTED NOT DETECTED Final   Streptococcus pyogenes NOT DETECTED NOT DETECTED Final    Acinetobacter baumannii NOT DETECTED NOT DETECTED Final   Enterobacteriaceae species DETECTED (A) NOT DETECTED Final    Comment: Enterobacteriaceae  represent a large family of gram-negative bacteria, not a single organism. CRITICAL RESULT CALLED TO, READ BACK BY AND VERIFIED WITH: SCOTT HALL 12/10/2019 AT 0405 HS    Enterobacter cloacae complex NOT DETECTED NOT DETECTED Final   Escherichia coli DETECTED (A) NOT DETECTED Final    Comment: CRITICAL RESULT CALLED TO, READ BACK BY AND VERIFIED WITH: SCOTT HALL 12/10/2019 AT 0405 HS    Klebsiella oxytoca NOT DETECTED NOT DETECTED Final   Klebsiella pneumoniae NOT DETECTED NOT DETECTED Final   Proteus species NOT DETECTED NOT DETECTED Final   Serratia marcescens NOT DETECTED NOT DETECTED Final   Carbapenem resistance NOT DETECTED NOT DETECTED Final   Haemophilus influenzae NOT DETECTED NOT DETECTED Final   Neisseria meningitidis NOT DETECTED NOT DETECTED Final   Pseudomonas aeruginosa NOT DETECTED NOT DETECTED Final   Candida albicans NOT DETECTED NOT DETECTED Final   Candida glabrata NOT DETECTED NOT DETECTED Final   Candida krusei NOT DETECTED NOT DETECTED Final   Candida parapsilosis NOT DETECTED NOT DETECTED Final   Candida tropicalis NOT DETECTED NOT DETECTED Final    Comment: Performed at [facility], [address] [place], [place] [address]  Blood Culture (routine x 2)     Status: None (Preliminary result)   Collection Time: 12/14/19  1:01 PM   Specimen: BLOOD  Result Value Ref Range Status   Specimen Description BLOOD BLOOD LEFT WRIST  Final   Special Requests   Final    BOTTLES DRAWN AEROBIC AND ANAEROBIC Blood Culture results Agena not be optimal due to an inadequate volume of blood received in culture bottles   Culture   Final    NO GROWTH 3 DAYS Performed at [facility], [address] [place], [place] [address]    Report Status PENDING  Incomplete  Urine culture     Status: None   Collection Time:  12/14/19  1:01 PM   Specimen: Urine, Random  Result Value Ref Range Status   Specimen Description   Final    URINE, RANDOM Performed at [facility], [address], [place], [place] [address]    Special Requests   Final    NONE Performed at [facility], [address] [place], [place] [address]    Culture   Final    NO GROWTH Performed at [facility], [address], [place], [place] [address]    Report Status 12/15/2019 FINAL  Final  Respiratory Panel by RT PCR (Flu A&B, Covid) - Nasopharyngeal Swab     Status: None   Collection Time: 12/14/19  1:02 PM   Specimen: Nasopharyngeal Swab  Result Value Ref Range Status   SARS Coronavirus 2 by RT PCR NEGATIVE NEGATIVE Final    Comment: (NOTE) SARS-CoV-2 target nucleic acids are NOT DETECTED. The SARS-CoV-2 RNA is generally detectable in upper respiratoy specimens during the acute phase of infection. The lowest concentration of SARS-CoV-2 viral copies this assay can detect is 131 copies/mL. A negative result does not preclude SARS-Cov-2 infection and should not be used as the sole basis for treatment or other patient management decisions. A negative result Hiley occur with  improper specimen collection/handling, submission of specimen other than nasopharyngeal swab, presence of viral mutation(s) within the areas targeted by this assay, and inadequate number of viral copies (<131 copies/mL). A negative result must be combined with clinical observations, patient history, and epidemiological information. The expected result is Negative. Fact Sheet for Patients:  https://www.moore.com/ Fact Sheet for Healthcare Providers:  https://www.young.biz/ This test is not yet ap proved or cleared by the [place] and  has been authorized for detection and/or diagnosis of SARS-CoV-2 by FDA under an Emergency Use Authorization (EUA). This EUA will remain  in effect (meaning  this test can be used) for the duration of the COVID-19 declaration under Section 564(b)(1) of the Act, 21 U.S.C. section 360bbb-3(b)(1), unless the authorization is terminated or revoked sooner.    Influenza A by PCR NEGATIVE NEGATIVE Final   Influenza B by PCR NEGATIVE NEGATIVE Final    Comment: (NOTE) The Xpert Xpress SARS-CoV-2/FLU/RSV assay is intended as an aid in  the diagnosis of influenza from Nasopharyngeal swab specimens and  should not be used as a sole basis for treatment. Nasal washings and  aspirates are unacceptable for Xpert Xpress SARS-CoV-2/FLU/RSV  testing. Fact Sheet for Patients: https://www.moore.com/ Fact Sheet for Healthcare Providers: https://www.young.biz/ This test is not yet approved or cleared by the [place] FDA and  has been authorized for detection and/or diagnosis of SARS-CoV-2 by  FDA under an Emergency Use Authorization (EUA). This EUA will remain  in effect (meaning this test can be used) for the duration of the  Covid-19 declaration under Section 564(b)(1) of the Act, 21  U.S.C. section 360bbb-3(b)(1), unless the authorization is  terminated or revoked. Performed at [facility], [address] [place], [place] [address]   Blood Culture (routine x 2)     Status: None (Preliminary result)   Collection Time: 12/14/19  1:06 PM   Specimen: BLOOD  Result Value Ref Range Status   Specimen Description BLOOD RIGHT ANTECUBITAL  Final   Special Requests   Final    BOTTLES DRAWN AEROBIC AND ANAEROBIC Blood Culture adequate volume   Culture   Final    NO GROWTH 3 DAYS Performed at [facility], [address] [place], [place] [address]    Report Status PENDING  Incomplete         Radiology Studies: DG Ankle 2 Views Left  Result Date: 12/15/2019 CLINICAL DATA:  Bilateral ankle pain. EXAM: LEFT ANKLE - 2 VIEW COMPARISON:  None. FINDINGS: No fracture or bone lesion. The ankle joint  is normally spaced and aligned. No significant arthropathic change. There is mild diffuse surrounding subcutaneous soft tissue edema. IMPRESSION: 1. No fracture, bone lesion or ankle joint abnormality. Electronically Signed   By: Amie Portland M.D.   On: 12/15/2019 13:41   DG Ankle 2 Views Right  Result Date: 12/15/2019 CLINICAL DATA:  Bilateral ankle pain. EXAM: RIGHT ANKLE - 2 VIEW COMPARISON:  None. FINDINGS: No fracture or bone lesion. Ankle joint normally spaced and aligned. No significant arthropathic change. There is mild diffuse subcutaneous soft tissue edema. IMPRESSION: No fracture, bone lesion or ankle joint abnormality. Electronically Signed   By: Amie Portland M.D.   On: 12/15/2019 13:42        Scheduled Meds: . ALPRAZolam  0.5 mg Oral TID  . amLODipine  10 mg Oral Daily  . enalapril  5 mg Oral Daily  . enoxaparin (LOVENOX) injection  40 mg Subcutaneous Q24H  . feeding supplement (ENSURE ENLIVE)  237 mL Oral BID BM  . folic acid  1 mg Oral Daily  . multivitamin with minerals  1 tablet Oral Daily  . nebivolol  10 mg Oral Daily  . tamsulosin  0.4 mg Oral Daily  . thiamine  100 mg Oral Daily   Continuous Infusions: . sodium chloride Stopped (12/17/19 0602)  .  ceFEPime (MAXIPIME) IV 201 mL/hr at 12/17/19 0605     LOS: 3 days    Time spent: 30 mins     Charise KillianJamiese M Zeno Hickel, MD [facility] Pager [phone]  If 7PM-7AM, please contact night-coverage [email or web address] Password [email or web address] 12/17/2019, 7:40 AM

## 2019-12-17 NOTE — Plan of Care (Signed)
Patient resting quietly- reports feeling "worn out with this UTI".  Denies pain or discomfort.  Will continue to monitor.

## 2019-12-18 LAB — COMPREHENSIVE METABOLIC PANEL
ALT: 34 U/L (ref 0–44)
AST: 30 U/L (ref 15–41)
Albumin: 2.4 g/dL — ABNORMAL LOW (ref 3.5–5.0)
Alkaline Phosphatase: 115 U/L (ref 38–126)
Anion gap: 10 (ref 5–15)
BUN: 21 mg/dL (ref 8–23)
CO2: 30 mmol/L (ref 22–32)
Calcium: 9 mg/dL (ref 8.9–10.3)
Chloride: 98 mmol/L (ref 98–111)
Creatinine, Ser: 0.79 mg/dL (ref 0.61–1.24)
GFR calc Af Amer: >60 mL/min (ref >60)
GFR calc non Af Amer: >60 mL/min (ref >60)
Glucose, Bld: 128 mg/dL — ABNORMAL HIGH (ref 70–99)
Potassium: 3.7 mmol/L (ref 3.5–5.1)
Sodium: 138 mmol/L (ref 135–145)
Total Bilirubin: 0.8 mg/dL (ref 0.3–1.2)
Total Protein: 6.6 g/dL (ref 6.5–8.1)

## 2019-12-18 LAB — CBC
HCT: 30.7 % — ABNORMAL LOW (ref 39.0–52.0)
Hemoglobin: 10.5 g/dL — ABNORMAL LOW (ref 13.0–17.0)
MCH: 28.8 pg (ref 26.0–34.0)
MCHC: 34.2 g/dL (ref 30.0–36.0)
MCV: 84.3 fL (ref 80.0–100.0)
Platelets: 626 10*3/uL — ABNORMAL HIGH (ref 150–400)
RBC: 3.64 MIL/uL — ABNORMAL LOW (ref 4.22–5.81)
RDW: 12.1 % (ref 11.5–15.5)
WBC: 15.2 10*3/uL — ABNORMAL HIGH (ref 4.0–10.5)
nRBC: 0 % (ref 0.0–0.2)

## 2019-12-18 NOTE — TOC Progression Note (Signed)
Transition of Care [facility]) - Progression Note    Patient Details  Name: Luke Haynes MRN: [id number] Date of Birth: 09-05-1941  [facility]) CM/SW Contact  Larwance Rote, LCSW Phone Number: 12/18/2019, 10:49 AM  Clinical Narrative:    Patient and his wife accepted bed offer from [facility].  Facility notified.   Plan: Discharge to [facility] 12/17/2018.  Needs new COVID-19 test.   Expected Discharge Plan: Skilled Nursing Facility Barriers to Discharge: Continued Medical Work up  Expected Discharge Plan and Services Expected Discharge Plan: Skilled Nursing Facility   Discharge Planning Services: CM Consult Post Acute Care Choice: Skilled Nursing Facility Living arrangements for the past 2 months: Single Family Home                   Social Determinants of Health (SDOH) Interventions    Readmission Risk Interventions No flowsheet data found.

## 2019-12-18 NOTE — Progress Notes (Signed)
Pharmacy Antibiotic Note  Luke Haynes is a 79 y.o. male admitted on 12/14/2019 with UTI/sepsis  Pharmacy has been consulted for Cefepime dosing.  Recent admission 12/23-12/27/2020 for UTI/bacteremia and was on Meropenem then discharged home on cephalexin. Patients stated that he did take antibiotics as prescribed but never felt back to normal after discharge.   Plan: Day 5 IV abx.  Will continue Cefepime 2 gm IV q8h for Sepsis secondary to UTI. Bcx and UCx NGTD.   Will continue to F/u on cultures and renal fxn and adjust as needed.    Height: 5\' 9"  (175.3 cm) Weight: 212 lb (96.2 kg) IBW/kg (Calculated) : 70.7  Temp (24hrs), Avg:97.9 F (36.6 C), Min:97.7 F (36.5 C), Max:98.2 F (36.8 C)  Recent Labs  Lab 12/14/19 1301 12/15/19 0432 12/16/19 0616 12/17/19 0515 12/18/19 0428  WBC 20.9* 13.5* 13.8* 14.6* 15.2*  CREATININE 0.99 0.89 0.82 0.83 0.79  LATICACIDVEN 1.9  --   --   --   --     Estimated Creatinine Clearance: 87.1 mL/min (by C-G formula based on SCr of 0.79 mg/dL).    Allergies  Allergen Reactions  . Sulfa Antibiotics     Antimicrobials this admission: Ceftriaxone 12/23>> 12/24 Meropenem 12/25>> 12/27   Vanc 12/30 x 1 Zosyn 12/30  x1 Cefepime 12/30 >>  Microbiology results: 12/30 BCx: pending 12/30 UCx: pending  Thank you for allowing pharmacy to be a part of this patient's care.  1/31, PharmD, BCPS Clinical Pharmacist 12/18/2019 2:24 PM

## 2019-12-18 NOTE — Plan of Care (Signed)
Patient denies pain or discomfort.  Ambulated to chair with walker and staff assist- sat and ate lunch.  Stayed awake all shift.  Call bell in reach.  Will continue to assess.

## 2019-12-18 NOTE — Progress Notes (Signed)
PROGRESS NOTE    Luke Haynes  WUJ:[id number] DOB: [birth year] DOA: 12/14/2019 PCP: Albina Billet, MD      Assessment & Plan:   Active Problems:   Sepsis secondary to UTI ([place])   Bilateral ankle pain   Cystitis  Sepsis: secondary to UTI. Continue on IV cefepime. Blood cx NGTD. Resolved  UTI: UA shows trace leukocytosis, urine cx shows no growth. Continue on IV cefepime x1 day more. Pt is unsure of how many doses of abxs he took while he was at home   B/l ankle pain & swelling: pt denies any recent falls or trauma. XR b/l ankle shows mild diffuse subcutaneous soft tissue edema. No fracture, bone lesion or ankle joint abnormality.   Leukocytosis: secondary to infection. Continue on IV abxs  Thrombocytosis: likely reactive. Will continue to monitor   Normocytic anemia: H&H are stable. No need for a transfusion at this time. Will continue to monitor   Generalized weakness: PT/OT recommends SNF. Pt is agreeable to SNF  DVT prophylaxis: lovenox Code Status: full  Family Communication:  Disposition Plan: pt is stable for d/c to SNF. Pt [facility] a [facility] at [facility] but needs repeat COVID test before leaving. COVID19 test ordered    Consultants:   n/a   Procedures: n/a  Antimicrobials: cefepime   Subjective: Pt c/o fatigue  Objective: Vitals:   12/17/19 0759 12/17/19 1513 12/18/19 0026 12/18/19 0752  BP: (!) 144/71 133/70 (!) 144/83 (!) 156/78  Pulse: 74 75 73 74  Resp:   17 16  Temp: 97.6 F (36.4 C) 97.9 F (36.6 C) 98.2 F (36.8 C) 97.7 F (36.5 C)  TempSrc: Oral Oral Oral Oral  SpO2: 94% 96% 96% 96%  Weight:      Height:        Intake/Output Summary (Last 24 hours) at 12/18/2019 0808 Last data filed at 12/17/2019 1800 Gross per 24 hour  Intake 813.55 ml  Output --  Net 813.55 ml   Filed Weights   12/14/19 1301 12/14/19 2359  Weight: 90 kg 96.2 kg    Examination:  General exam: Appears calm and comfortable   Respiratory system: decreased  breath sounds b/l. No rales or rhonchi  Cardiovascular system: S1 & S2 +. No  rubs, gallops or clicks.  Gastrointestinal system: Abdomen is nondistended, soft and nontender. Normal bowel sounds heard. Central nervous system: Alert and oriented. Moves all 4 extremities Psychiatry: Judgement and insight appear normal. Normal mood and affect.     Data Reviewed: I have personally reviewed following labs and imaging studies  CBC: Recent Labs  Lab 12/14/19 1301 12/15/19 0432 12/16/19 0616 12/17/19 0515 12/18/19 0428  WBC 20.9* 13.5* 13.8* 14.6* 15.2*  NEUTROABS 16.9*  --   --   --   --   HGB 13.2 10.9* 10.4* 10.5* 10.5*  HCT 40.2 31.9* 32.0* 30.8* 30.7*  MCV 89.1 85.3 88.6 84.8 84.3  PLT 599* 492* 545* 566* 956*   Basic Metabolic Panel: Recent Labs  Lab 12/14/19 1301 12/15/19 0432 12/16/19 0616 12/17/19 0515 12/18/19 0428  NA 134* 138 136 136 138  K 3.9 3.7 3.6 3.5 3.7  CL 96* 102 99 97* 98  CO2 26 28 27 27 30   GLUCOSE 193* 142* 137* 134* 128*  BUN 17 20 18 21 21   CREATININE 0.99 0.89 0.82 0.83 0.79  CALCIUM 8.8* 8.3* 8.4* 8.9 9.0   GFR: Estimated Creatinine Clearance: 87.1 mL/min (by C-G formula based on SCr of 0.79 mg/dL). Liver  Function Tests: Recent Labs  Lab 12/14/19 1301 12/17/19 0515 12/18/19 0428  AST 44* 32 30  ALT 42 33 34  ALKPHOS 177* 121 115  BILITOT 1.0 0.6 0.8  PROT 7.7 6.6 6.6  ALBUMIN 3.0* 2.4* 2.4*   No results for input(s): LIPASE, AMYLASE in the last 168 hours. No results for input(s): AMMONIA in the last 168 hours. Coagulation Profile: Recent Labs  Lab 12/14/19 1301 12/15/19 0432  INR 0.9 1.0   Cardiac Enzymes: No results for input(s): CKTOTAL, CKMB, CKMBINDEX, TROPONINI in the last 168 hours. BNP (last 3 results) No results for input(s): PROBNP in the last 8760 hours. HbA1C: No results for input(s): HGBA1C in the last 72 hours. CBG: No results for input(s): GLUCAP in the last 168 hours. Lipid Profile: No results for input(s):  CHOL, HDL, LDLCALC, TRIG, CHOLHDL, LDLDIRECT in the last 72 hours. Thyroid Function Tests: No results for input(s): TSH, T4TOTAL, FREET4, T3FREE, THYROIDAB in the last 72 hours. Anemia Panel: No results for input(s): VITAMINB12, FOLATE, FERRITIN, TIBC, IRON, RETICCTPCT in the last 72 hours. Sepsis Labs: Recent Labs  Lab 12/14/19 1301 12/15/19 0432  PROCALCITON  --  0.11  LATICACIDVEN 1.9  --     Recent Results (from the past 240 hour(s))  Blood Culture (routine x 2)     Status: None (Preliminary result)   Collection Time: 12/14/19  1:01 PM   Specimen: BLOOD  Result Value Ref Range Status   Specimen Description BLOOD BLOOD LEFT WRIST  Final   Special Requests   Final    BOTTLES DRAWN AEROBIC AND ANAEROBIC Blood Culture results Finkel not be optimal due to an inadequate volume of blood received in culture bottles   Culture   Final    NO GROWTH 4 DAYS Performed at [facility], [address], [place], [place] [address]    Report Status PENDING  Incomplete  Urine culture     Status: None   Collection Time: 12/14/19  1:01 PM   Specimen: Urine, Random  Result Value Ref Range Status   Specimen Description   Final    URINE, RANDOM Performed at [facility], [address], [place], [place] [address]    Special Requests   Final    NONE Performed at [facility], [address], [place], [place] [address]    Culture   Final    NO GROWTH Performed at [facility], [address] [place], [place] [address]    Report Status 12/15/2019 FINAL  Final  Respiratory Panel by RT PCR (Flu A&B, Covid) - Nasopharyngeal Swab     Status: None   Collection Time: 12/14/19  1:02 PM   Specimen: Nasopharyngeal Swab  Result Value Ref Range Status   SARS Coronavirus 2 by RT PCR NEGATIVE NEGATIVE Final    Comment: (NOTE) SARS-CoV-2 target nucleic acids are NOT DETECTED. The SARS-CoV-2 RNA is generally detectable in upper respiratoy specimens during the acute  phase of infection. The lowest concentration of SARS-CoV-2 viral copies this assay can detect is 131 copies/mL. A negative result does not preclude SARS-Cov-2 infection and should not be used as the sole basis for treatment or other patient management decisions. A negative result Pekar occur with  improper specimen collection/handling, submission of specimen other than nasopharyngeal swab, presence of viral mutation(s) within the areas targeted by this assay, and inadequate number of viral copies (<131 copies/mL). A negative result must be combined with clinical observations, patient history, and epidemiological information. The  expected result is Negative. Fact Sheet for Patients:  https://www.moore.com/ Fact Sheet for Healthcare Providers:  https://www.young.biz/ This test is not yet ap proved or cleared by the [place] FDA and  has been authorized for detection and/or diagnosis of SARS-CoV-2 by FDA under an Emergency Use Authorization (EUA). This EUA will remain  in effect (meaning this test can be used) for the duration of the COVID-19 declaration under Section 564(b)(1) of the Act, 21 U.S.C. section 360bbb-3(b)(1), unless the authorization is terminated or revoked sooner.    Influenza A by PCR NEGATIVE NEGATIVE Final   Influenza B by PCR NEGATIVE NEGATIVE Final    Comment: (NOTE) The Xpert Xpress SARS-CoV-2/FLU/RSV assay is intended as an aid in  the diagnosis of influenza from Nasopharyngeal swab specimens and  should not be used as a sole basis for treatment. Nasal washings and  aspirates are unacceptable for Xpert Xpress SARS-CoV-2/FLU/RSV  testing. Fact Sheet for Patients: https://www.moore.com/ Fact Sheet for Healthcare Providers: https://www.young.biz/ This test is not yet approved or cleared by the [place] FDA and  has been authorized for detection and/or diagnosis of SARS-CoV-2 by   FDA under an Emergency Use Authorization (EUA). This EUA will remain  in effect (meaning this test can be used) for the duration of the  Covid-19 declaration under Section 564(b)(1) of the Act, 21  U.S.C. section 360bbb-3(b)(1), unless the authorization is  terminated or revoked. Performed at [facility], [address] [place], [place] [address]   Blood Culture (routine x 2)     Status: None (Preliminary result)   Collection Time: 12/14/19  1:06 PM   Specimen: BLOOD  Result Value Ref Range Status   Specimen Description BLOOD RIGHT ANTECUBITAL  Final   Special Requests   Final    BOTTLES DRAWN AEROBIC AND ANAEROBIC Blood Culture adequate volume   Culture   Final    NO GROWTH 4 DAYS Performed at [facility], [address], [place], [place] [address]    Report Status PENDING  Incomplete         Radiology Studies: No results found.      Scheduled Meds: . ALPRAZolam  0.5 mg Oral TID  . amLODipine  10 mg Oral Daily  . enalapril  5 mg Oral Daily  . enoxaparin (LOVENOX) injection  40 mg Subcutaneous Q24H  . feeding supplement (ENSURE ENLIVE)  237 mL Oral BID BM  . folic acid  1 mg Oral Daily  . multivitamin with minerals  1 tablet Oral Daily  . nebivolol  10 mg Oral Daily  . tamsulosin  0.4 mg Oral Daily  . thiamine  100 mg Oral Daily   Continuous Infusions: . sodium chloride Stopped (12/17/19 0602)  . ceFEPime (MAXIPIME) IV 2 g (12/18/19 0514)     LOS: 4 days    Time spent: 30 mins     Charise Killian, MD [facility] Pager [phone]  If 7PM-7AM, please contact night-coverage [email or web address] Password [email or web address] 12/18/2019, 8:08 AM

## 2019-12-19 DIAGNOSIS — G8929 Other chronic pain: Secondary | ICD-10-CM

## 2019-12-19 LAB — CBC
HCT: 30.9 % — ABNORMAL LOW (ref 39.0–52.0)
Hemoglobin: 10.8 g/dL — ABNORMAL LOW (ref 13.0–17.0)
MCH: 29.1 pg (ref 26.0–34.0)
MCHC: 35 g/dL (ref 30.0–36.0)
MCV: 83.3 fL (ref 80.0–100.0)
Platelets: 577 10*3/uL — ABNORMAL HIGH (ref 150–400)
RBC: 3.71 MIL/uL — ABNORMAL LOW (ref 4.22–5.81)
RDW: 12.1 % (ref 11.5–15.5)
WBC: 15.2 10*3/uL — ABNORMAL HIGH (ref 4.0–10.5)
nRBC: 0 % (ref 0.0–0.2)

## 2019-12-19 LAB — BASIC METABOLIC PANEL
Anion gap: 8 (ref 5–15)
BUN: 18 mg/dL (ref 8–23)
CO2: 29 mmol/L (ref 22–32)
Calcium: 9.1 mg/dL (ref 8.9–10.3)
Chloride: 97 mmol/L — ABNORMAL LOW (ref 98–111)
Creatinine, Ser: 0.69 mg/dL (ref 0.61–1.24)
GFR calc Af Amer: >60 mL/min (ref >60)
GFR calc non Af Amer: >60 mL/min (ref >60)
Glucose, Bld: 142 mg/dL — ABNORMAL HIGH (ref 70–99)
Potassium: 3.9 mmol/L (ref 3.5–5.1)
Sodium: 134 mmol/L — ABNORMAL LOW (ref 135–145)

## 2019-12-19 LAB — CULTURE, BLOOD (ROUTINE X 2)
Culture: NO GROWTH
Culture: NO GROWTH
Special Requests: ADEQUATE

## 2019-12-19 LAB — SARS CORONAVIRUS 2 (TAT 6-24 HRS): SARS Coronavirus 2: NEGATIVE

## 2019-12-19 NOTE — Progress Notes (Signed)
Physical Therapy Treatment Patient Details Name: Luke Haynes MRN: [id number] DOB: 09-23-1941 Today's Date: 12/19/2019    History of Present Illness Luke Haynes is a 79 y.o. male with medical history significant of hypertension, recurrent UTIs who was initially admitted and treated for UTI and associated E. coli bacteremia and was discharged on 12/11/2019.  On the previous presentation he endorsed laying in bed for 2 to 3 days prior to presenting to the ED urinating and stooling on himself.  At baseline he is able to ambulate.  After discharge she "did not feel right yet" and was not able to handle his activities of daily living at home.  He was discharged on p.o. cephalexin per culture sensitivities and did endorse adherence to the medication regimen however symptoms did not resolve and he presented back to the emergency department    PT Comments    Pt ready for session.  Feeling better.  To edge of bed with rail and increased time but no hands on assist.  Stood with mod a x 1.  While he continues with increased forward lean, overall balance is improved from last session.  He is able take several marches in place with min a with extra care to prevent forward LOB.  Seated rest due to fatigue.  After break, he is able to stand and transfer to recliner with close min a x 1.  Participated in exercises as described below.  Pt remains appropriate for SNF and is motivated to improve overall mobility.   Follow Up Recommendations  SNF     Equipment Recommendations  Rolling walker with 5" wheels    Recommendations for Other Services       Precautions / Restrictions Precautions Precautions: Fall Restrictions Weight Bearing Restrictions: No    Mobility  Bed Mobility Overal bed mobility: Needs Assistance Bed Mobility: Supine to Sit     Supine to sit: Supervision     General bed mobility comments: increased effort but no physical assist needed.  Transfers Overall transfer level: Needs  assistance Equipment used: Rolling walker (2 wheeled) Transfers: Sit to/from Stand Sit to Stand: Min assist            Ambulation/Gait Ambulation/Gait assistance: Min assist;Mod assist Gait Distance (Feet): 3 Feet Assistive device: Rolling walker (2 wheeled) Gait Pattern/deviations: Step-to pattern;Decreased step length - right;Decreased step length - left Gait velocity: decreased       Stairs             Wheelchair Mobility    Modified Rankin (Stroke Patients Only)       Balance Overall balance assessment: Needs assistance Sitting-balance support: No upper extremity supported;Feet supported Sitting balance-Leahy Scale: Fair     Standing balance support: Single extremity supported Standing balance-Leahy Scale: Poor Standing balance comment: improved balance today but continues to require hands on assist at all times.                            Cognition Arousal/Alertness: Awake/alert Behavior During Therapy: WFL for tasks assessed/performed Overall Cognitive Status: Within Functional Limits for tasks assessed                                        Exercises Other Exercises Other Exercises: seated ankle pumps, LAQ, ab/add and marches 2 x 10 in sitting, x 10 marches in standing.  General Comments        Pertinent Vitals/Pain Pain Assessment: Faces Faces Pain Scale: Hurts a little bit Pain Location: neck Pain Descriptors / Indicators: Aching Pain Intervention(s): Limited activity within patient's tolerance;Monitored during session    Home Living                      Prior Function            PT Goals (current goals can now be found in the care plan section) Progress towards PT goals: Progressing toward goals    Frequency    Min 2X/week      PT Plan Current plan remains appropriate    Co-evaluation              AM-PAC PT "6 Clicks" Mobility   Outcome Measure  Help needed turning from  your back to your side while in a flat bed without using bedrails?: A Little Help needed moving from lying on your back to sitting on the side of a flat bed without using bedrails?: A Little Help needed moving to and from a bed to a chair (including a wheelchair)?: A Lot Help needed standing up from a chair using your arms (e.g., wheelchair or bedside chair)?: A Lot Help needed to walk in hospital room?: A Lot Help needed climbing 3-5 steps with a railing? : Total 6 Click Score: 13    End of Session Equipment Utilized During Treatment: Gait belt Activity Tolerance: Patient tolerated treatment well Patient left: in chair;with call bell/phone within reach;with chair alarm set Nurse Communication: Mobility status       Time: 5784-6962 PT Time Calculation (min) (ACUTE ONLY): 14 min  Charges:  $Therapeutic Exercise: 8-22 mins                    Chesley Noon, PTA 12/19/19, 2:04 PM

## 2019-12-19 NOTE — TOC Transition Note (Signed)
Transition of Care [facility]) - CM/SW Discharge Note   Patient Details  Name: Luke Haynes MRN: [id number] Date of Birth: [birth year]  Transition of [facility] CM/SW Contact:  Barrie Dunker, RN Phone Number: 12/19/2019, 2:44 PM   Clinical Narrative:    Patient to DC to [facility] today to room 2 B, Notified Irving Burton family member Bedside nurse to call report to [facility] and to call EMS when ready to transport     Barriers to Discharge: Continued Medical Work up   Patient Goals and CMS Choice Patient states their goals for this hospitalization and ongoing recovery are:: to get strong enough to get back home and get this infection taken care of      Discharge Placement                       Discharge Plan and Services   Discharge Planning Services: CM Consult Post Acute Care Choice: Skilled Nursing Facility                               Social Determinants of Health (SDOH) Interventions     Readmission Risk Interventions No flowsheet data found.

## 2019-12-19 NOTE — Progress Notes (Signed)
Called report to [place] at [facility] care. Answered all questions. EMS called for transport

## 2019-12-19 NOTE — Discharge Summary (Signed)
Physician Discharge Summary  Luke Haynes ZOX:[id number] DOB: Nov 04, 1941 DOA: 12/14/2019  PCP: Jaclyn Shaggy, MD  Admit date: 12/14/2019 Discharge date: 12/19/2019  Admitted From: home Disposition:  SNF, [facility]  Recommendations for Outpatient Follow-up:  1. Follow up with PCP in 1-2 weeks  Home Health: no Equipment/Devices:  Discharge Condition:stable CODE STATUS:full  Diet recommendation: Heart Healthy  Brief/Interim Summary: HPI was taken from Dr. Georgeann Oppenheim:  Luke Haynes is a 79 y.o. male with medical history significant of hypertension, recurrent UTIs who was initially admitted and treated for UTI and associated E. coli bacteremia and was discharged on 12/11/2019.  On the previous presentation he endorsed laying in bed for 2 to 3 days prior to presenting to the ED urinating and stooling on himself.  At baseline he is able to ambulate.  After discharge she "did not feel right yet" and was not able to handle his activities of daily living at home.  He was discharged on p.o. cephalexin per culture sensitivities and did endorse adherence to the medication regimen however symptoms did not resolve and he presented back to the emergency department.    On presentation in the emergency department he does have an elevated white count of 20.9 with neutrophilic predominance.  Remainder of metabolic profile overall reassuring.  CT abdomen pelvis was performed to rule out infected stone and no stone or hydronephrosis was noted.  Considering the patient's persistent symptoms internal medicine was called for admission.  ED Course: As above.  Presentation patient was given 1L normal saline fluid bolus followed by 1 dose of vancomycin and Zosyn.  Blood cultures and urine cultures were drawn.  Urinalysis is not overtly consistent with infection.  Blood cultures are pending at time of this note.  Internal medicine called for admission.  Hospital Course from Dr. Wilfred Lacy 12/15/19-12/19/19: Pt  was readmitted and found to be septic likely secondary to UTI. Pt was d/c home previously on po abxs but pt is unsure if he took the medication or not. As a result, urine cx was redrawn and IV abxs were restarted. Urine cx did not grow anything. Pt completed abx course prior to d/c. Of note, PT/OT saw the pt and recommended SNF.   Discharge Diagnoses:  Active Problems:   Sepsis secondary to UTI (HCC)   Bilateral ankle pain   Cystitis  Sepsis: secondary to UTI. Completed abx course. Blood cx NGTD. Resolved  UTI: UA shows trace leukocytosis, urine cx shows no growth. Completed abx course. Pt is unsure of how many doses of abxs he took while he was at home so abxs were restarted as pt was septic on admission but has now resolved  B/l ankle pain & swelling: pt denies any recent falls or trauma. XR b/l ankle shows mild diffuse subcutaneous soft tissue edema. No fracture, bone lesion or ankle joint abnormality.   Leukocytosis: secondary to infection. Completed abx course   Thrombocytosis: likely reactive. Will continue to monitor   Normocytic anemia: H&H are stable. No need for a transfusion at this time. Will continue to monitor   Generalized weakness: PT/OT recommends SNF. Pt is agreeable to SNF  Discharge Instructions  Discharge Instructions    Diet - low sodium heart healthy   Complete by: As directed    Discharge instructions   Complete by: As directed    F/u w/ PCP in 1-2 weeks   Increase activity slowly   Complete by: As directed      Allergies as of  12/19/2019      Reactions   Sulfa Antibiotics       Medication List    STOP taking these medications   cephALEXin 500 MG capsule Commonly known as: KEFLEX     TAKE these medications   ALPRAZolam 0.5 MG tablet Commonly known as: XANAX Take 0.5 mg by mouth 3 (three) times daily.   amLODipine 10 MG tablet Commonly known as: NORVASC Take 10 mg by mouth daily.   enalapril 5 MG tablet Commonly known as: VASOTEC Take  1 tablet (5 mg total) by mouth daily.   feeding supplement (ENSURE ENLIVE) Liqd Take 237 mLs by mouth 2 (two) times daily between meals.   HYDROcodone-acetaminophen 7.5-325 MG tablet Commonly known as: NORCO Take 1 tablet by mouth every 8 (eight) hours as needed.   lactobacillus acidophilus & bulgar chewable tablet Chew 1 tablet by mouth 3 (three) times daily with meals.   nebivolol 10 MG tablet Commonly known as: BYSTOLIC Take 1 tablet (10 mg total) by mouth daily.   tamsulosin 0.4 MG Caps capsule Commonly known as: FLOMAX Take 0.4 mg by mouth daily.       Allergies  Allergen Reactions  . Sulfa Antibiotics     Consultations:  none   Procedures/Studies: DG Chest 1 View  Result Date: 12/07/2019 CLINICAL DATA:  79 year old male with weakness. EXAM: CHEST  1 VIEW COMPARISON:  Chest radiograph dated 11/10/2019 FINDINGS: The lungs are clear. There is no pleural effusion pneumothorax. The cardiac silhouette is within normal limits. Atherosclerotic calcification of the aortic arch. No acute osseous pathology. IMPRESSION: No active cardiopulmonary disease. Electronically Signed   By: Elgie CollardArash  Radparvar M.D.   On: 12/07/2019 16:30   DG Ankle 2 Views Left  Result Date: 12/15/2019 CLINICAL DATA:  Bilateral ankle pain. EXAM: LEFT ANKLE - 2 VIEW COMPARISON:  None. FINDINGS: No fracture or bone lesion. The ankle joint is normally spaced and aligned. No significant arthropathic change. There is mild diffuse surrounding subcutaneous soft tissue edema. IMPRESSION: 1. No fracture, bone lesion or ankle joint abnormality. Electronically Signed   By: Amie Portlandavid  Ormond M.D.   On: 12/15/2019 13:41   DG Ankle 2 Views Right  Result Date: 12/15/2019 CLINICAL DATA:  Bilateral ankle pain. EXAM: RIGHT ANKLE - 2 VIEW COMPARISON:  None. FINDINGS: No fracture or bone lesion. Ankle joint normally spaced and aligned. No significant arthropathic change. There is mild diffuse subcutaneous soft tissue edema.  IMPRESSION: No fracture, bone lesion or ankle joint abnormality. Electronically Signed   By: Amie Portlandavid  Ormond M.D.   On: 12/15/2019 13:42   DG Abd 1 View  Result Date: 12/07/2019 CLINICAL DATA:  Sepsis EXAM: ABDOMEN - 1 VIEW COMPARISON:  None. FINDINGS: No dilated loops of large or small bowel. Gas and stool in the rectum. No pathologic calcifications. No organomegaly. No acute osseous abnormality IMPRESSION: No acute abdominal findings. Electronically Signed   By: Genevive BiStewart  Edmunds M.D.   On: 12/07/2019 20:19   DG Chest Port 1 View  Result Date: 12/14/2019 CLINICAL DATA:  Fever today.  Patient recently diagnosed with a UTI. EXAM: PORTABLE CHEST 1 VIEW COMPARISON:  Single-view of the chest 12/07/2019. FINDINGS: Lungs clear. Heart size normal. No pneumothorax or pleural fluid. No bony abnormality. IMPRESSION: Negative chest. Electronically Signed   By: Drusilla Kannerhomas  Dalessio M.D.   On: 12/14/2019 13:15   ECHOCARDIOGRAM COMPLETE  Result Date: 12/08/2019   ECHOCARDIOGRAM REPORT   Patient Name:   Luke GandyDWIGHT J Hoeppner Date of Exam: 12/08/2019 Medical Rec #:  [id number]    Height:       69.0 in Accession #:    [id number]   Weight:       198.4 lb Date of Birth:  Propp 06, 1942     BSA:          2.06 m Patient Age:    78 years     BP:           131/73 mmHg Patient Gender: M            HR:           83 bpm. Exam Location:  [facility] Procedure: 2D Echo, Cardiac Doppler and Color Doppler Indications:     Elevated troponin  History:         Patient has no prior history of Echocardiogram examinations.                  Risk Factors:Hypertension.  Sonographer:     Cristela Blue RDCS (AE) Referring Phys:  Kenn File DOUTOVA Diagnosing Phys: Adrian Blackwater MD  Sonographer Comments: Technically difficult study due to poor echo windows, no parasternal window, no subcostal window and suboptimal apical window. IMPRESSIONS  1. Left ventricular ejection fraction, by visual estimation, is 45 to 50%. The left ventricle has normal function. There is  mildly increased left ventricular hypertrophy.  2. Left ventricular diastolic parameters are consistent with Grade I diastolic dysfunction (impaired relaxation).  3. The left ventricle demonstrates global hypokinesis.  4. Global right ventricle has normal systolic function.The right ventricular size is normal. No increase in right ventricular wall thickness.  5. Left atrial size was normal.  6. Right atrial size was normal.  7. The mitral valve is normal in structure. No evidence of mitral valve regurgitation. No evidence of mitral stenosis.  8. The tricuspid valve is normal in structure.  9. The aortic valve is normal in structure. Aortic valve regurgitation is not visualized. No evidence of aortic valve sclerosis or stenosis. 10. The pulmonic valve was normal in structure. Pulmonic valve regurgitation is not visualized. 11. Normal pulmonary artery systolic pressure. 12. The inferior vena cava is normal in size with greater than 50% respiratory variability, suggesting right atrial pressure of 3 mmHg. FINDINGS  Left Ventricle: Left ventricular ejection fraction, by visual estimation, is 45 to 50%. The left ventricle has normal function. The left ventricle demonstrates global hypokinesis. There is mildly increased left ventricular hypertrophy. Left ventricular diastolic parameters are consistent with Grade I diastolic dysfunction (impaired relaxation). Normal left atrial pressure. Right Ventricle: The right ventricular size is normal. No increase in right ventricular wall thickness. Global RV systolic function is has normal systolic function. The tricuspid regurgitant velocity is 1.80 m/s, and with an assumed right atrial pressure  of 10 mmHg, the estimated right ventricular systolic pressure is normal at 23.0 mmHg. Left Atrium: Left atrial size was normal in size. Right Atrium: Right atrial size was normal in size Pericardium: There is no evidence of pericardial effusion. Mitral Valve: The mitral valve is normal in  structure. No evidence of mitral valve regurgitation. No evidence of mitral valve stenosis by observation. Tricuspid Valve: The tricuspid valve is normal in structure. Tricuspid valve regurgitation is not demonstrated. Aortic Valve: The aortic valve is normal in structure. Aortic valve regurgitation is not visualized. The aortic valve is structurally normal, with no evidence of sclerosis or stenosis. Aortic valve mean gradient measures 3.5 mmHg. Aortic valve peak gradient measures 6.1 mmHg. Aortic valve area, by VTI measures 2.54 cm. Pulmonic  Valve: The pulmonic valve was normal in structure. Pulmonic valve regurgitation is not visualized. Pulmonic regurgitation is not visualized. Aorta: The aortic root, ascending aorta and aortic arch are all structurally normal, with no evidence of dilitation or obstruction. Venous: The inferior vena cava is normal in size with greater than 50% respiratory variability, suggesting right atrial pressure of 3 mmHg. IAS/Shunts: No atrial level shunt detected by color flow Doppler. There is no evidence of a patent foramen ovale. No ventricular septal defect is seen or detected. There is no evidence of an atrial septal defect.  LEFT VENTRICLE PLAX 2D LVIDd:         4.88 cm  Diastology LVIDs:         3.47 cm  LV e' lateral:   7.51 cm/s LV PW:         1.38 cm  LV E/e' lateral: 12.1 LV IVS:        1.11 cm  LV e' medial:    6.09 cm/s LVOT diam:     2.00 cm  LV E/e' medial:  15.0 LV SV:         62 ml LV SV Index:   29.27 LVOT Area:     3.14 cm  RIGHT VENTRICLE RV Basal diam:  4.16 cm RV S prime:     13.40 cm/s TAPSE (M-mode): 3.5 cm LEFT ATRIUM             Index       RIGHT ATRIUM           Index LA diam:        3.50 cm 1.70 cm/m  RA Area:     20.50 cm LA Vol (A2C):   82.1 ml 39.87 ml/m RA Volume:   65.20 ml  31.67 ml/m LA Vol (A4C):   40.3 ml 19.57 ml/m LA Biplane Vol: 57.9 ml 28.12 ml/m  AORTIC VALVE AV Area (Vmax):    2.18 cm AV Area (Vmean):   2.21 cm AV Area (VTI):     2.54  cm AV Vmax:           123.50 cm/s AV Vmean:          87.800 cm/s AV VTI:            0.234 m AV Peak Grad:      6.1 mmHg AV Mean Grad:      3.5 mmHg LVOT Vmax:         85.70 cm/s LVOT Vmean:        61.900 cm/s LVOT VTI:          0.189 m LVOT/AV VTI ratio: 0.81  AORTA Ao Root diam: 2.10 cm MITRAL VALVE                         TRICUSPID VALVE MV Area (PHT): 5.27 cm              TR Peak grad:   13.0 mmHg MV PHT:        41.76 msec            TR Vmax:        186.00 cm/s MV Decel Time: 144 msec MV E velocity: 91.20 cm/s  103 cm/s  SHUNTS MV A velocity: 107.00 cm/s 70.3 cm/s Systemic VTI:  0.19 m MV E/A ratio:  0.85        1.5       Systemic Diam: 2.00 cm  Neoma Laming MD  Electronically signed by Adrian Blackwater MD Signature Date/Time: 12/08/2019/5:20:15 PM    Final    CT Renal Stone Study  Result Date: 12/14/2019 CLINICAL DATA:  Hematuria EXAM: CT ABDOMEN AND PELVIS WITHOUT CONTRAST TECHNIQUE: Multidetector CT imaging of the abdomen and pelvis was performed following the standard protocol without IV contrast. COMPARISON:  None. FINDINGS: Lower chest: Small right pleural effusion. Trace left pleural effusion. Mild bibasilar atelectasis. Hepatobiliary: No focal liver abnormality is seen. No gallstones, gallbladder wall thickening, or biliary dilatation. Pancreas: Unremarkable. Spleen: Unremarkable Adrenals/Urinary Tract: Adrenals are unremarkable. Small bilateral renal cysts. Mild perinephric stranding of which could be chronic. Limited evaluation for pyelonephritis on this study. There is no hydronephrosis. No calculi identified. Bladder is unremarkable. Stomach/Bowel: Few colonic diverticula.  Normal appendix. Vascular/Lymphatic: Aortic atherosclerosis. No enlarged abdominal or pelvic lymph nodes. Reproductive: Prostate is unremarkable. Other: Mild mesenteric infiltration. Right inguinal hernia containing distal ileum. Left inguinal hernia containing sigmoid. Musculoskeletal: Degenerative changes of the included spine.  IMPRESSION: No urinary tract calculi or hydronephrosis. Bilateral bowel containing inguinal hernias. Small pleural effusions and bibasilar atelectasis. Electronically Signed   By: Guadlupe Spanish M.D.   On: 12/14/2019 15:24    Subjective: Pt c/o fatigue  Discharge Exam: Vitals:   12/19/19 0030 12/19/19 0758  BP: (!) 143/74 (!) 143/80  Pulse: 77 72  Resp: 16   Temp: 97.8 F (36.6 C) 98.7 F (37.1 C)  SpO2: 95% 96%   Vitals:   12/18/19 0752 12/18/19 1622 12/19/19 0030 12/19/19 0758  BP: (!) 156/78 (!) 149/76 (!) 143/74 (!) 143/80  Pulse: 74 84 77 72  Resp: 16 14 16    Temp: 97.7 F (36.5 C) 99.2 F (37.3 C) 97.8 F (36.6 C) 98.7 F (37.1 C)  TempSrc: Oral Oral Oral Oral  SpO2: 96% 97% 95% 96%  Weight:      Height:        General: Pt is alert, awake, not in acute distress Cardiovascular:, S1/S2 +, no rubs, no gallops Respiratory: CTA bilaterally, no wheezing, no rhonchi Abdominal: Soft, NT, ND, bowel sounds + Extremities: no edema, no cyanosis    The results of significant diagnostics from this hospitalization (including imaging, microbiology, ancillary and laboratory) are listed below for reference.     Microbiology: Recent Results (from the past 240 hour(s))  Blood Culture (routine x 2)     Status: None   Collection Time: 12/14/19  1:01 PM   Specimen: BLOOD  Result Value Ref Range Status   Specimen Description BLOOD BLOOD LEFT WRIST  Final   Special Requests   Final    BOTTLES DRAWN AEROBIC AND ANAEROBIC Blood Culture results Szilagyi not be optimal due to an inadequate volume of blood received in culture bottles   Culture   Final    NO GROWTH 5 DAYS Performed at [facility], [address] [place], [place]    Report Status 12/19/2019 FINAL  Final  Urine culture     Status: None   Collection Time: 12/14/19  1:01 PM   Specimen: Urine, Random  Result Value Ref Range Status   Specimen Description   Final    URINE, RANDOM Performed at [facility], [address], [place], [place]    Special Requests   Final    NONE Performed at [facility], [address], [place], [place]    Culture   Final    NO GROWTH Performed at [facility], [address] [facility],  [place], [place] [address]    Report Status 12/15/2019 FINAL  Final  Respiratory Panel by RT PCR (Flu A&B, Covid) - Nasopharyngeal Swab     Status: None   Collection Time: 12/14/19  1:02 PM   Specimen: Nasopharyngeal Swab  Result Value Ref Range Status   SARS Coronavirus 2 by RT PCR NEGATIVE NEGATIVE Final    Comment: (NOTE) SARS-CoV-2 target nucleic acids are NOT DETECTED. The SARS-CoV-2 RNA is generally detectable in upper respiratoy specimens during the acute phase of infection. The lowest concentration of SARS-CoV-2 viral copies this assay can detect is 131 copies/mL. A negative result does not preclude SARS-Cov-2 infection and should not be used as the sole basis for treatment or other patient management decisions. A negative result Sieler occur with  improper specimen collection/handling, submission of specimen other than nasopharyngeal swab, presence of viral mutation(s) within the areas targeted by this assay, and inadequate number of viral copies (<131 copies/mL). A negative result must be combined with clinical observations, patient history, and epidemiological information. The expected result is Negative. Fact Sheet for Patients:  https://www.moore.com/https://www.fda.gov/media/142436/download Fact Sheet for Healthcare Providers:  https://www.young.biz/https://www.fda.gov/media/142435/download This test is not yet ap proved or cleared by the [place] FDA and  has been authorized for detection and/or diagnosis of SARS-CoV-2 by FDA under an Emergency Use Authorization (EUA). This EUA will remain  in effect (meaning this test can be used) for the duration of the COVID-19 declaration under Section 564(b)(1) of the Act, 21 U.S.C. section 360bbb-3(b)(1), unless  the authorization is terminated or revoked sooner.    Influenza A by PCR NEGATIVE NEGATIVE Final   Influenza B by PCR NEGATIVE NEGATIVE Final    Comment: (NOTE) The Xpert Xpress SARS-CoV-2/FLU/RSV assay is intended as an aid in  the diagnosis of influenza from Nasopharyngeal swab specimens and  should not be used as a sole basis for treatment. Nasal washings and  aspirates are unacceptable for Xpert Xpress SARS-CoV-2/FLU/RSV  testing. Fact Sheet for Patients: https://www.moore.com/https://www.fda.gov/media/142436/download Fact Sheet for Healthcare Providers: https://www.young.biz/https://www.fda.gov/media/142435/download This test is not yet approved or cleared by the [place] FDA and  has been authorized for detection and/or diagnosis of SARS-CoV-2 by  FDA under an Emergency Use Authorization (EUA). This EUA will remain  in effect (meaning this test can be used) for the duration of the  Covid-19 declaration under Section 564(b)(1) of the Act, 21  U.S.C. section 360bbb-3(b)(1), unless the authorization is  terminated or revoked. Performed at [facility], [address] [place], [place] [address]   Blood Culture (routine x 2)     Status: None   Collection Time: 12/14/19  1:06 PM   Specimen: BLOOD  Result Value Ref Range Status   Specimen Description BLOOD RIGHT ANTECUBITAL  Final   Special Requests   Final    BOTTLES DRAWN AEROBIC AND ANAEROBIC Blood Culture adequate volume   Culture   Final    NO GROWTH 5 DAYS Performed at [facility], [address] [place], [place] [address]    Report Status 12/19/2019 FINAL  Final  SARS CORONAVIRUS 2 (TAT 6-24 HRS) Nasopharyngeal Nasopharyngeal Swab     Status: None   Collection Time: 12/18/19  8:30 PM   Specimen: Nasopharyngeal Swab  Result Value Ref Range Status   SARS Coronavirus 2 NEGATIVE NEGATIVE Final    Comment: (NOTE) SARS-CoV-2 target nucleic acids are NOT DETECTED. The SARS-CoV-2 RNA is generally detectable in upper and lower respiratory  specimens during the acute phase of infection. Negative results  do not preclude SARS-CoV-2 infection, do not rule out co-infections with other pathogens, and should not be used as the sole basis for treatment or other patient management decisions. Negative results must be combined with clinical observations, patient history, and epidemiological information. The expected result is Negative. Fact Sheet for Patients: HairSlick.no Fact Sheet for Healthcare Providers: quierodirigir.com This test is not yet approved or cleared by the [place] FDA and  has been authorized for detection and/or diagnosis of SARS-CoV-2 by FDA under an Emergency Use Authorization (EUA). This EUA will remain  in effect (meaning this test can be used) for the duration of the COVID-19 declaration under Section 56 4(b)(1) of the Act, 21 U.S.C. section 360bbb-3(b)(1), unless the authorization is terminated or revoked sooner. Performed at [facility], [address] [place], [place] [address]      Labs: BNP (last 3 results) Recent Labs    11/09/19 1901 12/07/19 1638  BNP 113.0* 346.0*   Basic Metabolic Panel: Recent Labs  Lab 12/15/19 0432 12/16/19 0616 12/17/19 0515 12/18/19 0428 12/19/19 0456  NA 138 136 136 138 134*  K 3.7 3.6 3.5 3.7 3.9  CL 102 99 97* 98 97*  CO2 28 27 27 30 29   GLUCOSE 142* 137* 134* 128* 142*  BUN 20 18 21 21 18   CREATININE 0.89 0.82 0.83 0.79 0.69  CALCIUM 8.3* 8.4* 8.9 9.0 9.1   Liver Function Tests: Recent Labs  Lab 12/14/19 1301 12/17/19 0515 12/18/19 0428  AST 44* 32 30  ALT 42 33 34  ALKPHOS 177* 121 115  BILITOT 1.0 0.6 0.8  PROT 7.7 6.6 6.6  ALBUMIN 3.0* 2.4* 2.4*   No results for input(s): LIPASE, AMYLASE in the last 168 hours. No results for input(s): AMMONIA in the last 168 hours. CBC: Recent Labs  Lab 12/14/19 1301 12/15/19 0432 12/16/19 0616 12/17/19 0515 12/18/19 0428  12/19/19 0456  WBC 20.9* 13.5* 13.8* 14.6* 15.2* 15.2*  NEUTROABS 16.9*  --   --   --   --   --   HGB 13.2 10.9* 10.4* 10.5* 10.5* 10.8*  HCT 40.2 31.9* 32.0* 30.8* 30.7* 30.9*  MCV 89.1 85.3 88.6 84.8 84.3 83.3  PLT 599* 492* 545* 566* 626* 577*   Cardiac Enzymes: No results for input(s): CKTOTAL, CKMB, CKMBINDEX, TROPONINI in the last 168 hours. BNP: Invalid input(s): POCBNP CBG: No results for input(s): GLUCAP in the last 168 hours. D-Dimer No results for input(s): DDIMER in the last 72 hours. Hgb A1c No results for input(s): HGBA1C in the last 72 hours. Lipid Profile No results for input(s): CHOL, HDL, LDLCALC, TRIG, CHOLHDL, LDLDIRECT in the last 72 hours. Thyroid function studies No results for input(s): TSH, T4TOTAL, T3FREE, THYROIDAB in the last 72 hours.  Invalid input(s): FREET3 Anemia work up No results for input(s): VITAMINB12, FOLATE, FERRITIN, TIBC, IRON, RETICCTPCT in the last 72 hours. Urinalysis    Component Value Date/Time   COLORURINE YELLOW (A) 12/14/2019 1301   APPEARANCEUR CLEAR (A) 12/14/2019 1301   LABSPEC 1.007 12/14/2019 1301   PHURINE 6.0 12/14/2019 1301   GLUCOSEU >=500 (A) 12/14/2019 1301   HGBUR MODERATE (A) 12/14/2019 1301   BILIRUBINUR NEGATIVE 12/14/2019 1301   KETONESUR NEGATIVE 12/14/2019 1301   PROTEINUR 30 (A) 12/14/2019 1301   NITRITE NEGATIVE 12/14/2019 1301   LEUKOCYTESUR TRACE (A) 12/14/2019 1301   Sepsis Labs Invalid input(s): PROCALCITONIN,  WBC,  LACTICIDVEN Microbiology Recent Results (from the past 240 hour(s))  Blood Culture (routine x 2)  Status: None   Collection Time: 12/14/19  1:01 PM   Specimen: BLOOD  Result Value Ref Range Status   Specimen Description BLOOD BLOOD LEFT WRIST  Final   Special Requests   Final    BOTTLES DRAWN AEROBIC AND ANAEROBIC Blood Culture results Leitz not be optimal due to an inadequate volume of blood received in culture bottles   Culture   Final    NO GROWTH 5 DAYS Performed at  [facility], [address], [place], [place] [address]    Report Status 12/19/2019 FINAL  Final  Urine culture     Status: None   Collection Time: 12/14/19  1:01 PM   Specimen: Urine, Random  Result Value Ref Range Status   Specimen Description   Final    URINE, RANDOM Performed at [facility], [address] [place], [place] [address]    Special Requests   Final    NONE Performed at [facility], [address], [place], [place] [address]    Culture   Final    NO GROWTH Performed at [facility], [address], [place] [place], [place] [address]    Report Status 12/15/2019 FINAL  Final  Respiratory Panel by RT PCR (Flu A&B, Covid) - Nasopharyngeal Swab     Status: None   Collection Time: 12/14/19  1:02 PM   Specimen: Nasopharyngeal Swab  Result Value Ref Range Status   SARS Coronavirus 2 by RT PCR NEGATIVE NEGATIVE Final    Comment: (NOTE) SARS-CoV-2 target nucleic acids are NOT DETECTED. The SARS-CoV-2 RNA is generally detectable in upper respiratoy specimens during the acute phase of infection. The lowest concentration of SARS-CoV-2 viral copies this assay can detect is 131 copies/mL. A negative result does not preclude SARS-Cov-2 infection and should not be used as the sole basis for treatment or other patient management decisions. A negative result Arther occur with  improper specimen collection/handling, submission of specimen other than nasopharyngeal swab, presence of viral mutation(s) within the areas targeted by this assay, and inadequate number of viral copies (<131 copies/mL). A negative result must be combined with clinical observations, patient history, and epidemiological information. The expected result is Negative. Fact Sheet for Patients:  https://www.moore.com/ Fact Sheet for Healthcare Providers:  https://www.young.biz/ This test is not yet ap proved or cleared by the [place] FDA  and  has been authorized for detection and/or diagnosis of SARS-CoV-2 by FDA under an Emergency Use Authorization (EUA). This EUA will remain  in effect (meaning this test can be used) for the duration of the COVID-19 declaration under Section 564(b)(1) of the Act, 21 U.S.C. section 360bbb-3(b)(1), unless the authorization is terminated or revoked sooner.    Influenza A by PCR NEGATIVE NEGATIVE Final   Influenza B by PCR NEGATIVE NEGATIVE Final    Comment: (NOTE) The Xpert Xpress SARS-CoV-2/FLU/RSV assay is intended as an aid in  the diagnosis of influenza from Nasopharyngeal swab specimens and  should not be used as a sole basis for treatment. Nasal washings and  aspirates are unacceptable for Xpert Xpress SARS-CoV-2/FLU/RSV  testing. Fact Sheet for Patients: https://www.moore.com/ Fact Sheet for Healthcare Providers: https://www.young.biz/ This test is not yet approved or cleared by the [place] FDA and  has been authorized for detection and/or diagnosis of SARS-CoV-2 by  FDA under an Emergency Use Authorization (EUA). This EUA will remain  in effect (meaning this test can be used) for the duration of the  Covid-19 declaration under Section 564(b)(1) of  the Act, 21  U.S.C. section 360bbb-3(b)(1), unless the authorization is  terminated or revoked. Performed at [facility], [address] [place], [place] [address]   Blood Culture (routine x 2)     Status: None   Collection Time: 12/14/19  1:06 PM   Specimen: BLOOD  Result Value Ref Range Status   Specimen Description BLOOD RIGHT ANTECUBITAL  Final   Special Requests   Final    BOTTLES DRAWN AEROBIC AND ANAEROBIC Blood Culture adequate volume   Culture   Final    NO GROWTH 5 DAYS Performed at [facility], [address], [place], [place] [address]    Report Status 12/19/2019 FINAL  Final  SARS CORONAVIRUS 2 (TAT 6-24 HRS) Nasopharyngeal Nasopharyngeal Swab      Status: None   Collection Time: 12/18/19  8:30 PM   Specimen: Nasopharyngeal Swab  Result Value Ref Range Status   SARS Coronavirus 2 NEGATIVE NEGATIVE Final    Comment: (NOTE) SARS-CoV-2 target nucleic acids are NOT DETECTED. The SARS-CoV-2 RNA is generally detectable in upper and lower respiratory specimens during the acute phase of infection. Negative results do not preclude SARS-CoV-2 infection, do not rule out co-infections with other pathogens, and should not be used as the sole basis for treatment or other patient management decisions. Negative results must be combined with clinical observations, patient history, and epidemiological information. The expected result is Negative. Fact Sheet for Patients: HairSlick.no Fact Sheet for Healthcare Providers: quierodirigir.com This test is not yet approved or cleared by the [place] FDA and  has been authorized for detection and/or diagnosis of SARS-CoV-2 by FDA under an Emergency Use Authorization (EUA). This EUA will remain  in effect (meaning this test can be used) for the duration of the COVID-19 declaration under Section 56 4(b)(1) of the Act, 21 U.S.C. section 360bbb-3(b)(1), unless the authorization is terminated or revoked sooner. Performed at [facility], [address], [place], [place] [address]      Time coordinating discharge: Over 30 minutes  SIGNED:   Charise Killian, MD  [facility] 12/19/2019, 2:17 PM Pager   If 7PM-7AM, please contact night-coverage [email or web address] Password [email or web address]

## 2020-09-11 ENCOUNTER — Emergency Department (HOSPITAL_COMMUNITY)
Admission: EM | Admit: 2020-09-11 | Discharge: 2020-09-11 | Disposition: A | Discharge status: Home / Self Care | Payer: Medicare HMO | Attending: Emergency Medicine | Admitting: Emergency Medicine

## 2020-09-11 ENCOUNTER — Emergency Department (HOSPITAL_COMMUNITY): Payer: Medicare HMO

## 2020-09-11 ENCOUNTER — Other Ambulatory Visit: Payer: Self-pay

## 2020-09-11 ENCOUNTER — Encounter (HOSPITAL_COMMUNITY): Payer: Self-pay | Admitting: Emergency Medicine

## 2020-09-11 DIAGNOSIS — Z79899 Other long term (current) drug therapy: Secondary | ICD-10-CM | POA: Diagnosis not present

## 2020-09-11 DIAGNOSIS — I10 Essential (primary) hypertension: Secondary | ICD-10-CM | POA: Insufficient documentation

## 2020-09-11 DIAGNOSIS — G510 Bell's palsy: Secondary | ICD-10-CM | POA: Insufficient documentation

## 2020-09-11 LAB — COMPREHENSIVE METABOLIC PANEL
ALT: 31 U/L (ref 0–44)
AST: 26 U/L (ref 15–41)
Albumin: 3.9 g/dL (ref 3.5–5.0)
Alkaline Phosphatase: 77 U/L (ref 38–126)
Anion gap: 10 (ref 5–15)
BUN: 14 mg/dL (ref 8–23)
CO2: 25 mmol/L (ref 22–32)
Calcium: 9.4 mg/dL (ref 8.9–10.3)
Chloride: 99 mmol/L (ref 98–111)
Creatinine, Ser: 0.98 mg/dL (ref 0.61–1.24)
GFR calc Af Amer: >60 mL/min (ref >60)
GFR calc non Af Amer: >60 mL/min (ref >60)
Glucose, Bld: 197 mg/dL — ABNORMAL HIGH (ref 70–99)
Potassium: 3.9 mmol/L (ref 3.5–5.1)
Sodium: 134 mmol/L — ABNORMAL LOW (ref 135–145)
Total Bilirubin: 0.7 mg/dL (ref 0.3–1.2)
Total Protein: 6.9 g/dL (ref 6.5–8.1)

## 2020-09-11 LAB — DIFFERENTIAL
Abs Immature Granulocytes: 0.14 10*3/uL — ABNORMAL HIGH (ref 0.00–0.07)
Basophils Absolute: 0.1 10*3/uL (ref 0.0–0.1)
Basophils Relative: 1 %
Eosinophils Absolute: 0.4 10*3/uL (ref 0.0–0.5)
Eosinophils Relative: 4 %
Immature Granulocytes: 1 %
Lymphocytes Relative: 30 %
Lymphs Abs: 2.9 10*3/uL (ref 0.7–4.0)
Monocytes Absolute: 1 10*3/uL (ref 0.1–1.0)
Monocytes Relative: 10 %
Neutro Abs: 5.2 10*3/uL (ref 1.7–7.7)
Neutrophils Relative %: 54 %

## 2020-09-11 LAB — CBC
HCT: 44.5 % (ref 39.0–52.0)
Hemoglobin: 15 g/dL (ref 13.0–17.0)
MCH: 29.5 pg (ref 26.0–34.0)
MCHC: 33.7 g/dL (ref 30.0–36.0)
MCV: 87.4 fL (ref 80.0–100.0)
Platelets: 309 10*3/uL (ref 150–400)
RBC: 5.09 MIL/uL (ref 4.22–5.81)
RDW: 12.3 % (ref 11.5–15.5)
WBC: 9.8 10*3/uL (ref 4.0–10.5)
nRBC: 0 % (ref 0.0–0.2)

## 2020-09-11 LAB — CBG MONITORING, ED: Glucose-Capillary: 189 mg/dL — ABNORMAL HIGH (ref 70–99)

## 2020-09-11 LAB — PROTIME-INR
INR: 0.9 (ref 0.8–1.2)
Prothrombin Time: 11.8 seconds (ref 11.4–15.2)

## 2020-09-11 LAB — I-STAT CHEM 8, ED
BUN: 16 mg/dL (ref 8–23)
Calcium, Ion: 1.06 mmol/L — ABNORMAL LOW (ref 1.15–1.40)
Chloride: 98 mmol/L (ref 98–111)
Creatinine, Ser: 0.9 mg/dL (ref 0.61–1.24)
Glucose, Bld: 192 mg/dL — ABNORMAL HIGH (ref 70–99)
HCT: 43 % (ref 39.0–52.0)
Hemoglobin: 14.6 g/dL (ref 13.0–17.0)
Potassium: 3.9 mmol/L (ref 3.5–5.1)
Sodium: 135 mmol/L (ref 135–145)
TCO2: 26 mmol/L (ref 22–32)

## 2020-09-11 LAB — APTT: aPTT: 27 seconds (ref 24–36)

## 2020-09-11 MED ORDER — SODIUM CHLORIDE 0.9% FLUSH: 3.0000 mL | Freq: Once | INTRAVENOUS | Status: DC

## 2020-09-11 MED ORDER — PREDNISONE 20 MG PO TABS
60.0000 mg | ORAL_TABLET | Freq: Every day | ORAL | 0 refills | Status: AC
Start: 2020-09-11 — End: 2020-09-18

## 2020-09-11 NOTE — ED Provider Notes (Signed)
[facility] EMERGENCY DEPARTMENT Provider Note   CSN: [id number] Arrival date & time: 09/11/20  1729  An emergency department physician performed an initial assessment on this suspected stroke patient at 1732.  History Chief Complaint  Patient presents with  . Facial Droop    Luke Haynes is a 79 y.o. male.  79 year old male with past medical history below including hypertension, sepsis secondary to UTI who presents with right facial droop, slurred speech, and R sided weakness. EMS reports they were called for R facial droop, speech difficulty, and dragging R leg of unclear time of onset. Patient and family do not know when he was last seen normal. Patient notes several days of right eye irritation, they thought that he had pinkeye.  He has been applying eyedrops at home.  He denies any swallowing problems, complaints of pain, fevers, or recent illness.  No rash on face. No headache or vision problems. Code stroke called by EMS in route.  The history is provided by the patient and the EMS personnel.       Past Medical History:  Diagnosis Date  . Hypertension     Patient Active Problem List   Diagnosis Date Noted  . Cystitis   . Bilateral ankle pain   . Sepsis secondary to UTI (HCC) 12/14/2019  . Acute lower UTI 12/07/2019  . AKI (acute kidney injury) (HCC) 12/07/2019  . Dehydration 12/07/2019  . Essential hypertension 12/07/2019  . Hyponatremia 12/07/2019  . Debility 12/07/2019  . Rhabdomyolysis 12/07/2019  . Elevated troponin 12/07/2019  . Sepsis (HCC) 12/07/2019    History reviewed. No pertinent surgical history.     Family History  Problem Relation Age of Onset  . COPD Father     Social History   Tobacco Use  . Smoking status: Never Smoker  . Smokeless tobacco: Never Used  Substance Use Topics  . Alcohol use: Not Currently  . Drug use: Never    Home Medications Prior to Admission medications   Medication Sig Start Date End Date  Taking? Authorizing Provider  ALPRAZolam Prudy Feeler) 0.5 MG tablet Take 0.5 mg by mouth 3 (three) times daily. 11/09/19   [provider]  amLODipine (NORVASC) 10 MG tablet Take 10 mg by mouth daily. 11/24/19   [provider]  enalapril (VASOTEC) 5 MG tablet Take 1 tablet (5 mg total) by mouth daily. 12/11/19   Lynn Ito, MD  feeding supplement, ENSURE ENLIVE, (ENSURE ENLIVE) LIQD Take 237 mLs by mouth 2 (two) times daily between meals. 12/11/19   Lynn Ito, MD  HYDROcodone-acetaminophen (NORCO) 7.5-325 MG tablet Take 1 tablet by mouth every 8 (eight) hours as needed. 11/18/19   [provider]  lactobacillus acidophilus & bulgar (LACTINEX) chewable tablet Chew 1 tablet by mouth 3 (three) times daily with meals. 12/11/19   Lynn Ito, MD  nebivolol (BYSTOLIC) 10 MG tablet Take 1 tablet (10 mg total) by mouth daily. 12/12/19   Lynn Ito, MD  predniSONE (DELTASONE) 20 MG tablet Take 3 tablets (60 mg total) by mouth daily with breakfast for 7 days. 09/11/20 09/18/20  Luisfelipe Engelstad, Ambrose Finland, MD  tamsulosin (FLOMAX) 0.4 MG CAPS capsule Take 0.4 mg by mouth daily. 10/13/19   [provider]    Allergies    Sulfa antibiotics  Review of Systems   Review of Systems All other systems reviewed and are negative except that which was mentioned in HPI  Physical Exam Updated Vital Signs BP (!) 147/85   Pulse 75  Resp 13   SpO2 96%   Physical Exam Vitals and nursing note reviewed.  Constitutional:      General: He is not in acute distress.    Appearance: He is well-developed.     Comments: Awake, alert  HENT:     Head: Normocephalic and atraumatic.     Right Ear: Tympanic membrane and ear canal normal.     Left Ear: Tympanic membrane and ear canal normal.  Eyes:     Extraocular Movements: Extraocular movements intact.     Pupils: Pupils are equal, round, and reactive to light.     Comments: Mild R eye conjunctival injection  Cardiovascular:     Rate  and Rhythm: Normal rate and regular rhythm.     Heart sounds: Normal heart sounds. No murmur heard.   Pulmonary:     Effort: Pulmonary effort is normal. No respiratory distress.     Breath sounds: Normal breath sounds.  Abdominal:     General: Bowel sounds are normal. There is no distension.     Palpations: Abdomen is soft.     Tenderness: There is no abdominal tenderness.  Musculoskeletal:     Cervical back: Neck supple.  Skin:    General: Skin is warm and dry.     Comments: No rash on face  Neurological:     Mental Status: He is alert and oriented to person, place, and time.     Cranial Nerves: No cranial nerve deficit.     Motor: No abnormal muscle tone.     Deep Tendon Reflexes: Reflexes are normal and symmetric.     Comments: Speech difficult to understand likely 2/2 edentulous and R facial droop; R facial droop involving forehead and mouth, unable to completely close R eye; remainder of CN II-XII intact; normal finger-to-nose testing, negative pronator drift, no clonus 5/5 strength and normal sensation x all 4 extremities  Psychiatric:        Thought Content: Thought content normal.        Judgment: Judgment normal.     ED Results / Procedures / Treatments   Labs (all labs ordered are listed, but only abnormal results are displayed) Labs Reviewed  DIFFERENTIAL - Abnormal; Notable for the following components:      Result Value   Abs Immature Granulocytes 0.14 (*)    All other components within normal limits  COMPREHENSIVE METABOLIC PANEL - Abnormal; Notable for the following components:   Sodium 134 (*)    Glucose, Bld 197 (*)    All other components within normal limits  I-STAT CHEM 8, ED - Abnormal; Notable for the following components:   Glucose, Bld 192 (*)    Calcium, Ion 1.06 (*)    All other components within normal limits  CBG MONITORING, ED - Abnormal; Notable for the following components:   Glucose-Capillary 189 (*)    All other components within normal  limits  PROTIME-INR  APTT  CBC  ETHANOL    EKG EKG Interpretation  Date/Time:  Tuesday September 11 2020 18:05:19 EDT Ventricular Rate:  77 PR Interval:    QRS Duration: 101 QT Interval:  386 QTC Calculation: 437 R Axis:   1 Text Interpretation: Sinus rhythm Abnormal R-wave progression, early transition since previous tracing, PVCs have resolved, otherwise similar Confirmed by Frederick Peers [phone]) on 09/11/2020 6:27:16 PM   Radiology CT HEAD CODE STROKE WO CONTRAST  Result Date: 09/11/2020 CLINICAL DATA:  Code stroke.  Slurred speech. EXAM: CT HEAD WITHOUT CONTRAST TECHNIQUE: Contiguous  axial images were obtained from the base of the skull through the vertex without intravenous contrast. COMPARISON:  11/09/2019 FINDINGS: Brain: No acute cortically based infarct, intracranial hemorrhage, mass, midline shift, or extra-axial fluid collection is identified. Confluent hypodensities in the cerebral white matter bilaterally are unchanged and nonspecific but compatible with severe chronic small vessel ischemic disease. Mild cerebral atrophy is unchanged. Vascular: Calcified atherosclerosis at the skull base. No hyperdense vessel. Skull: No fracture or suspicious osseous lesion. Sinuses/Orbits: Chronic maxillary sinusitis with mucosal thickening greater on the right. Unchanged large right mastoid and right middle ear effusions. Unremarkable orbits. Other: None. ASPECTS [facility] Stroke Program Early CT Score) - Ganglionic level infarction (caudate, lentiform nuclei, internal capsule, insula, M1-M3 cortex): 7 - Supraganglionic infarction (M4-M6 cortex): 3 Total score (0-10 with 10 being normal): 10 IMPRESSION: 1. No evidence of acute intracranial abnormality. 2. ASPECTS is 10. 3. Severe chronic small vessel ischemic disease. 4. 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 These results were called by telephone at the time of interpretation on 09/11/2020 at 5:49 pm to Dr. Thomasena Edis, who  verbally acknowledged these results. Electronically Signed   By: Sebastian Ache M.D.   On: 09/11/2020 17:49    Procedures Procedures (including critical care time)  Medications Ordered in ED Medications  sodium chloride flush (NS) 0.9 % injection 3 mL (3 mLs Intravenous Not Given 09/11/20 1821)    ED Course  I have reviewed the triage vital signs and the nursing notes.  Pertinent labs & imaging results that were available during my care of the patient were reviewed by me and considered in my medical decision making (see chart for details).    MDM Rules/Calculators/A&P                          Pt arrived as code stroke called by EMS, immediately taken to CT scan where head CT negative acute. Unclear time of onset of symptoms. Dr. Thomasena Edis, neurologist, evaluated pt and determined sx c/w Bells palsy; code stroke canceled. On my exam, pt has evidence of Bells palsy and otherwise has normal strength and sensation of extremities, no other neurologic findings. I have discussed treatment w/ steroids and emphasized eye care, provided w/ eye patch and instructions on lubricating eye. Pt to f/u with PCP. Return precautions reviewed. Final Clinical Impression(s) / ED Diagnoses Final diagnoses:  Bell's palsy    Rx / DC Orders ED Discharge Orders         Ordered    predniSONE (DELTASONE) 20 MG tablet  Daily with breakfast        09/11/20 1846           Maylyn Narvaiz, Ambrose Finland, MD 09/11/20 2333

## 2020-09-11 NOTE — Code Documentation (Signed)
Pt from home where he is typically independent. Pt had been seen in the hospital within the past few days for a UTI. We were also told he has pink eye and not able to close his right eye completely. Pt is not able to tell us when his LKW was and family (daughter-in-law) could not either. Pt was taken to [facility] after  EMS activated a code stroke for facial droop, right sided weakness, and slurred speech. There was some confusion to the story and LKW at the time the code was called. Pt was met by Stroke team and EDP at [facility] and taken for a CT. Since we have no LKW patient is not a candidate for TPA or any other acute treatment. Pt's NIHSS is a 2 for slurred speech and slight facial droop on exam. After neurologist examined pt he believes it is a case of Bell's Palsy. Since a stroke is not suspected the Code Stroke was cancelled. Hand off with Annamary Carolin, RN. Aleeha Boline, Rande Brunt, RN

## 2020-09-11 NOTE — Discharge Instructions (Signed)
Protect your eye at night by using eye patch or taping shut when sleeping. Use artificial tears without preservative for eye lubrication/dry eyes. Take steroids as prescribed.  Follow up with primary care provider next week. Return to ER if any new symptoms such as arm or leg weakness, confusion, fever, or severe headache.

## 2020-09-11 NOTE — ED Triage Notes (Signed)
Pt BIB [facility] as a code stroke, family reports pt has right sided facial droop, slurred speech and has been dragging his right leg. Unknown LKW. Pt A&O x 4.

## 2020-09-11 NOTE — Consult Note (Signed)
Neurology Consult  CC: right face droop unknown onset  History is obtained from:patient and EMS  HPI: Luke Haynes is a 79 y.o. male brought in with right face droop of unknown onset. States that he developed difficulty closing his right eye and inability to purse his lips. He said this was preceded by dull pain in his right neck radiating down from behind his ear. Otherwise no preceding illness.   He does not smoke tobacco.   LKW: Unknown tpa given?: No, unclear onset IR Thrombectomy? No Modified Rankin Scale: 0-Completely asymptomatic and back to baseline post- stroke NIHSS: 2 complete facial palsy  ROS: A complete ROS was performed and is negative except as noted in the HPI.   Past Medical History:  Diagnosis Date  . Hypertension    Family History  Problem Relation Age of Onset  . COPD Father    Social History:  reports that he has never smoked. He has never used smokeless tobacco. He reports previous alcohol use. He reports that he does not use drugs.  Prior to Admission medications   Medication Sig Start Date End Date Taking? Authorizing Provider  ALPRAZolam Prudy Feeler) 0.5 MG tablet Take 0.5 mg by mouth 3 (three) times daily. 11/09/19   [provider]  amLODipine (NORVASC) 10 MG tablet Take 10 mg by mouth daily. 11/24/19   [provider]  enalapril (VASOTEC) 5 MG tablet Take 1 tablet (5 mg total) by mouth daily. 12/11/19   Lynn Ito, MD  feeding supplement, ENSURE ENLIVE, (ENSURE ENLIVE) LIQD Take 237 mLs by mouth 2 (two) times daily between meals. 12/11/19   Lynn Ito, MD  HYDROcodone-acetaminophen (NORCO) 7.5-325 MG tablet Take 1 tablet by mouth every 8 (eight) hours as needed. 11/18/19   [provider]  lactobacillus acidophilus & bulgar (LACTINEX) chewable tablet Chew 1 tablet by mouth 3 (three) times daily with meals. 12/11/19   Lynn Ito, MD  nebivolol (BYSTOLIC) 10 MG tablet Take 1 tablet (10 mg total) by mouth daily. 12/12/19    Lynn Ito, MD  tamsulosin (FLOMAX) 0.4 MG CAPS capsule Take 0.4 mg by mouth daily. 10/13/19   [provider]   Exam: Current vital signs: There were no vitals taken for this visit.   Physical Exam  Constitutional: Appears well-developed and well-nourished.  Psych: Affect appropriate to situation Eyes: No scleral injection HENT: No OP obstrucion Head: Normocephalic.  Cardiovascular: Normal rate and regular rhythm.  Respiratory: Effort normal and breath sounds normal to anterior ascultation GI: Soft.  No distension. There is no tenderness.  Skin: WDI  Neuro: Mental Status: Patient is awake, alert, oriented to person, place, month, year, and situation. Patient is able to give a clear and coherent history. No signs of aphasia or neglect - The patient is edentulous. Injected sclera right>>left Cranial Nerves: II: Visual Fields are full. Pupils are equal, round, and reactive to light. III,IV, VI: EOMI without ptosis or diploplia.  Complete right facial palsy. VIII: hearing is intact to voice X: Uvula elevates symmetrically XI: Shoulder shrug is symmetric. XII: tongue is midline without atrophy or fasciculations.  Motor: Tone is normal. Bulk is normal. 5/5 strength was present in all four extremities.  Sensory: Sensation is symmetric to light touch and temperature in the arms and legs. Deep Tendon Reflexes: 2+ and symmetric in the biceps and patellae.  Plantars: Toes are downgoing bilaterally.  Cerebellar: FNF and HKS are intact bilaterally  I have reviewed the images obtained: NCT head did not show acute intracranial  abnormality.  Impression: 79 year old man with complete right facial palsy with somewhat spared involuntary facial expression and without dysphagia. 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   Exam is reassuring. Recommend patching eye especially during sleep and using preservative-free eye drops. Literature  suggests ~80% complete recovery, however not in my experience.   In the interest of health maintenance - control of modifiable cerebrovascular risk facts such as blood pressure, cholesterol and blood glucose with close PCP follow up.   Electronically signed by: Dr. Marisue Humble Pager: [phone]

## 2021-09-01 IMAGING — US US EXTREM LOW VENOUS
1 series · 13 of 24 positions shown · non-contrast
Comparison: None.

CLINICAL DATA: Bilateral leg swelling for several days



[Series 1: us extrem low venous · 0.08mm/px · 13 of 62 slices shown]
[im 1/62]
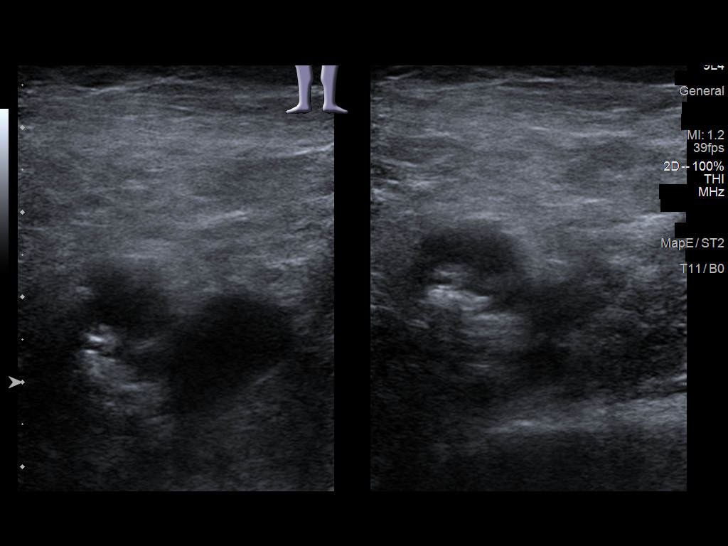
[im 6/62]
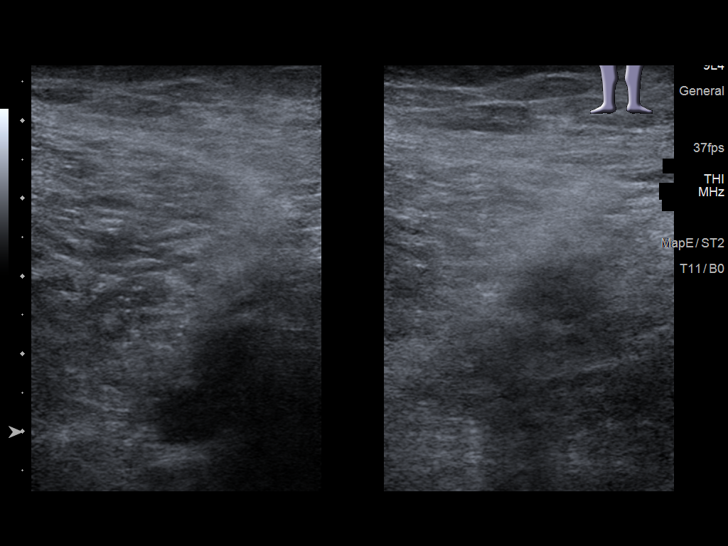
[im 11/62]
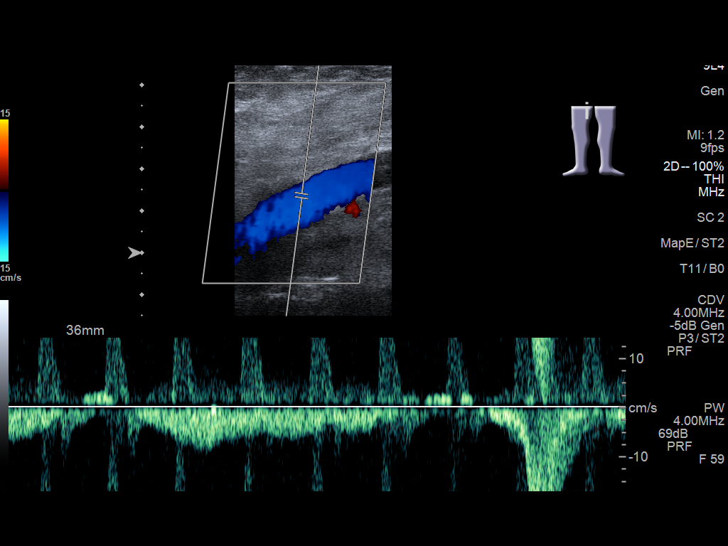
[im 16/62]
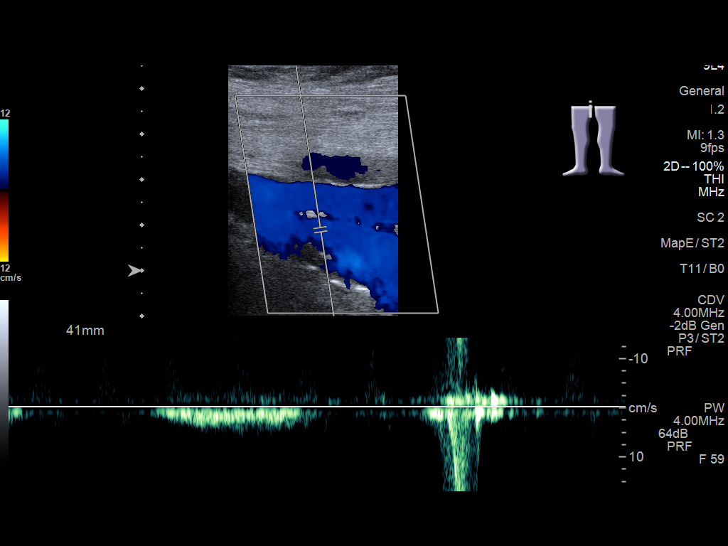
[im 22/62]
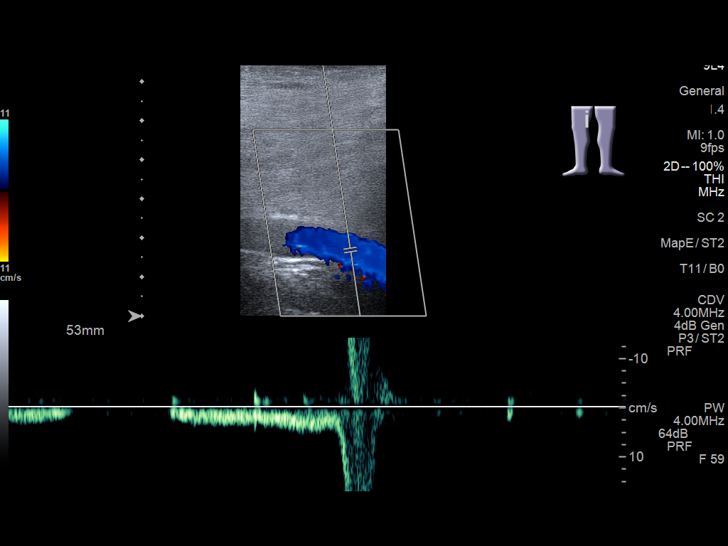
[im 27/62]
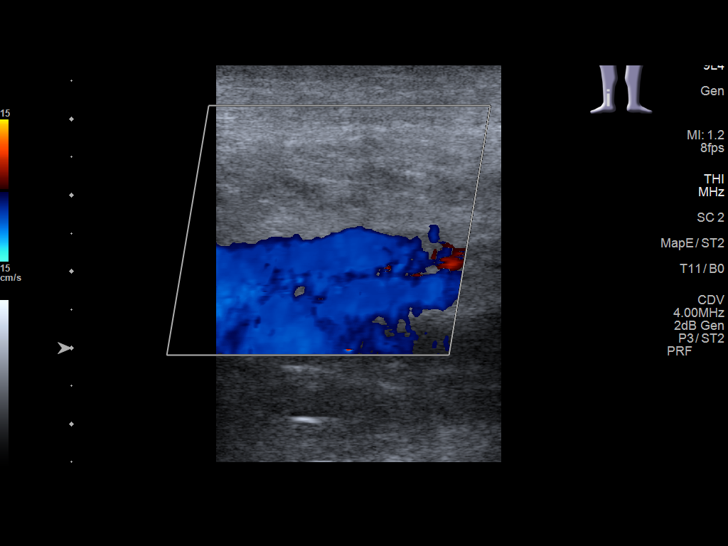
[im 32/62]
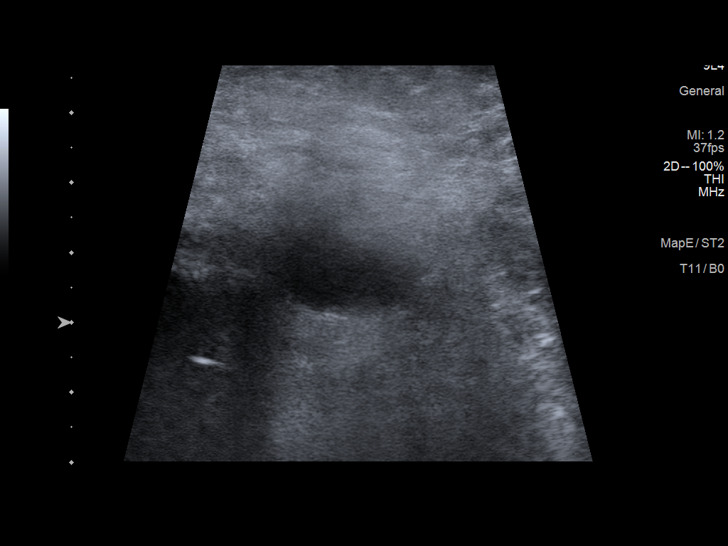
[im 35/62]
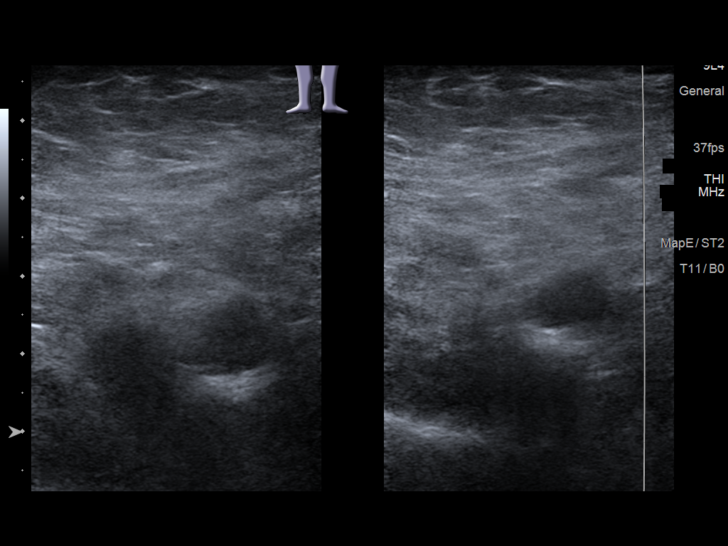
[im 40/62]
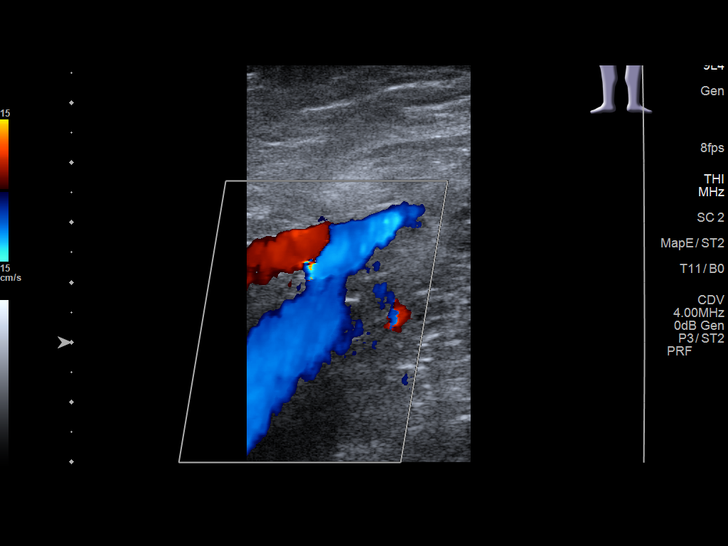
[im 46/62]
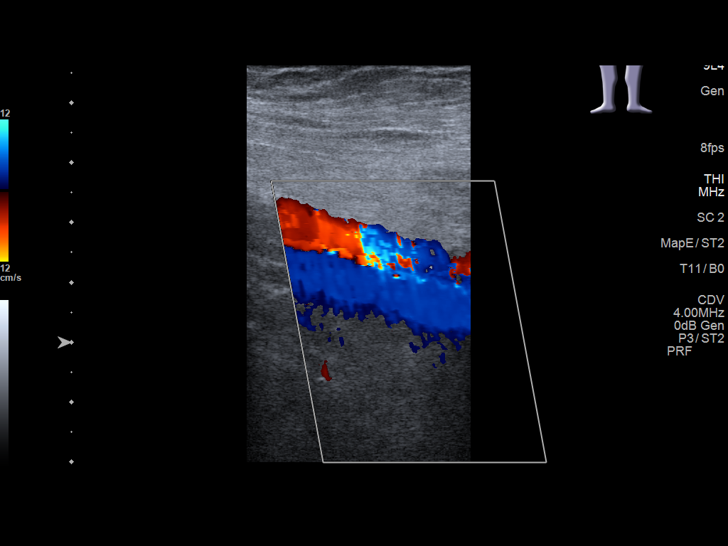
[im 51/62]
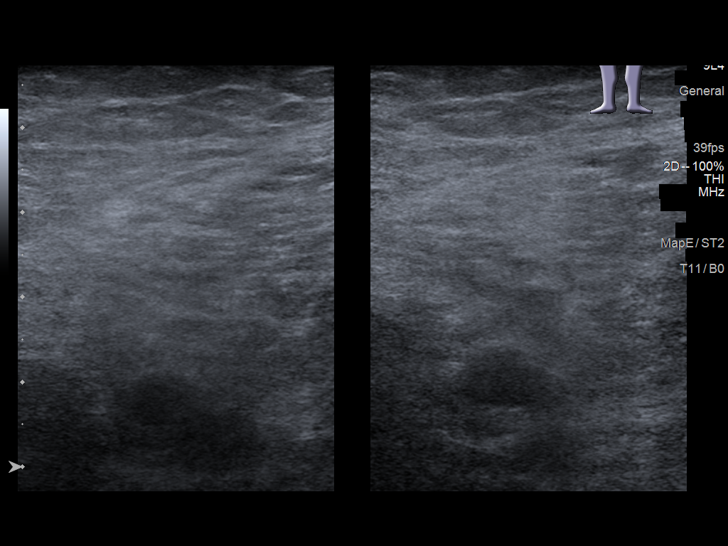
[im 56/62]
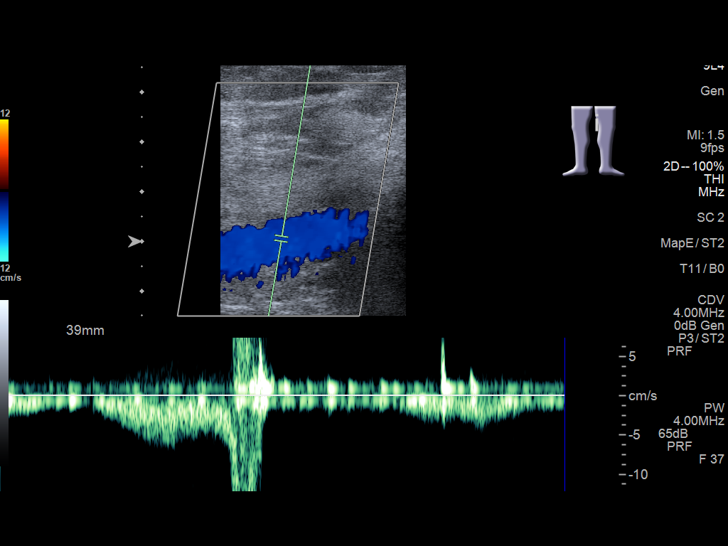
[im 62/62]
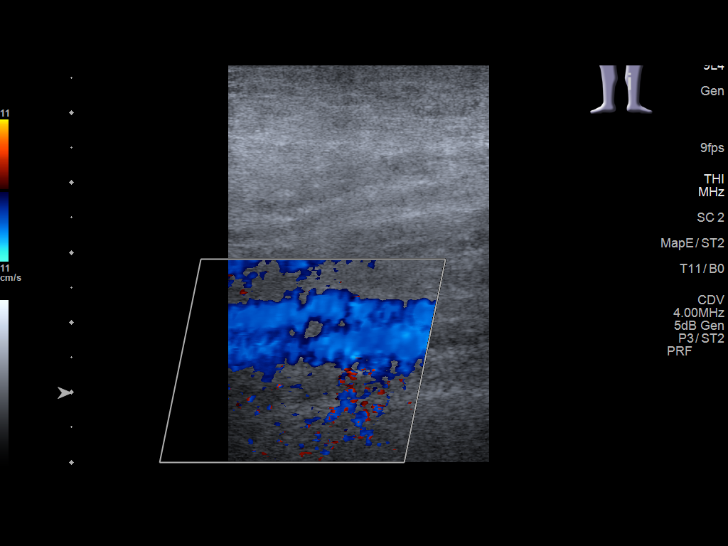

[13 of 24 positions shown; findings below may reference images not displayed]

FINDINGS: RIGHT LOWER EXTREMITY

Common Femoral Vein: No evidence of thrombus. Normal
compressibility, respiratory phasicity and response to augmentation.

Saphenofemoral Junction: No evidence of thrombus. Normal
compressibility and flow on color Doppler imaging.

Profunda Femoral Vein: No evidence of thrombus. Normal
compressibility and flow on color Doppler imaging.

Femoral Vein: No evidence of thrombus. Normal compressibility,
respiratory phasicity and response to augmentation.

Popliteal Vein: No evidence of thrombus. Normal compressibility,
respiratory phasicity and response to augmentation.

Calf Veins: No evidence of thrombus. Normal compressibility and flow
on color Doppler imaging.

Superficial Great Saphenous Vein: No evidence of thrombus. Normal
compressibility.

Venous Reflux:  None.

Other Findings: Complex fluid collection measuring 4.8 x 0.9 x
cm consistent with a popliteal cyst.

LEFT LOWER EXTREMITY

Common Femoral Vein: No evidence of thrombus. Normal
compressibility, respiratory phasicity and response to augmentation.

Saphenofemoral Junction: No evidence of thrombus. Normal
compressibility and flow on color Doppler imaging.

Profunda Femoral Vein: No evidence of thrombus. Normal
compressibility and flow on color Doppler imaging.

Femoral Vein: No evidence of thrombus. Normal compressibility,
respiratory phasicity and response to augmentation.

Popliteal Vein: No evidence of thrombus. Normal compressibility,
respiratory phasicity and response to augmentation.

Calf Veins: No evidence of thrombus. Normal compressibility and flow
on color Doppler imaging.

Superficial Great Saphenous Vein: No evidence of thrombus. Normal
compressibility.

Venous Reflux:  None.

Other Findings:  None.
IMPRESSION: No evidence of deep venous thrombosis in either lower extremity.

Right popliteal cyst.

## 2021-09-10 ENCOUNTER — Emergency Department: Payer: Medicare Other

## 2021-09-10 ENCOUNTER — Other Ambulatory Visit: Payer: Self-pay

## 2021-09-10 ENCOUNTER — Observation Stay
Admission: EM | Admit: 2021-09-10 | Discharge: 2021-09-11 | Disposition: A | Discharge status: Home / Self Care | Payer: Medicare Other | Attending: Internal Medicine | Admitting: Internal Medicine

## 2021-09-10 DIAGNOSIS — F1510 Other stimulant abuse, uncomplicated: Secondary | ICD-10-CM | POA: Diagnosis not present

## 2021-09-10 DIAGNOSIS — N400 Enlarged prostate without lower urinary tract symptoms: Secondary | ICD-10-CM | POA: Diagnosis not present

## 2021-09-10 DIAGNOSIS — I10 Essential (primary) hypertension: Secondary | ICD-10-CM | POA: Diagnosis not present

## 2021-09-10 DIAGNOSIS — R5381 Other malaise: Secondary | ICD-10-CM | POA: Diagnosis present

## 2021-09-10 DIAGNOSIS — Z9181 History of falling: Secondary | ICD-10-CM | POA: Diagnosis not present

## 2021-09-10 DIAGNOSIS — Z79899 Other long term (current) drug therapy: Secondary | ICD-10-CM | POA: Insufficient documentation

## 2021-09-10 DIAGNOSIS — S50811A Abrasion of right forearm, initial encounter: Secondary | ICD-10-CM | POA: Insufficient documentation

## 2021-09-10 DIAGNOSIS — F419 Anxiety disorder, unspecified: Secondary | ICD-10-CM | POA: Diagnosis not present

## 2021-09-10 DIAGNOSIS — S50812A Abrasion of left forearm, initial encounter: Secondary | ICD-10-CM | POA: Diagnosis not present

## 2021-09-10 DIAGNOSIS — D72829 Elevated white blood cell count, unspecified: Secondary | ICD-10-CM | POA: Diagnosis not present

## 2021-09-10 DIAGNOSIS — F0391 Unspecified dementia with behavioral disturbance: Secondary | ICD-10-CM | POA: Diagnosis not present

## 2021-09-10 DIAGNOSIS — R451 Restlessness and agitation: Secondary | ICD-10-CM | POA: Diagnosis not present

## 2021-09-10 DIAGNOSIS — E876 Hypokalemia: Secondary | ICD-10-CM | POA: Diagnosis not present

## 2021-09-10 DIAGNOSIS — F1910 Other psychoactive substance abuse, uncomplicated: Secondary | ICD-10-CM | POA: Diagnosis present

## 2021-09-10 DIAGNOSIS — Y900 Blood alcohol level of less than 20 mg/100 ml: Secondary | ICD-10-CM | POA: Insufficient documentation

## 2021-09-10 DIAGNOSIS — Z7984 Long term (current) use of oral hypoglycemic drugs: Secondary | ICD-10-CM | POA: Diagnosis not present

## 2021-09-10 DIAGNOSIS — F17210 Nicotine dependence, cigarettes, uncomplicated: Secondary | ICD-10-CM | POA: Insufficient documentation

## 2021-09-10 DIAGNOSIS — M25521 Pain in right elbow: Secondary | ICD-10-CM | POA: Insufficient documentation

## 2021-09-10 DIAGNOSIS — F0390 Unspecified dementia without behavioral disturbance: Secondary | ICD-10-CM | POA: Insufficient documentation

## 2021-09-10 DIAGNOSIS — Z20822 Contact with and (suspected) exposure to covid-19: Secondary | ICD-10-CM | POA: Diagnosis not present

## 2021-09-10 DIAGNOSIS — X58XXXA Exposure to other specified factors, initial encounter: Secondary | ICD-10-CM | POA: Diagnosis not present

## 2021-09-10 DIAGNOSIS — R404 Transient alteration of awareness: Secondary | ICD-10-CM | POA: Diagnosis not present

## 2021-09-10 DIAGNOSIS — R4182 Altered mental status, unspecified: Secondary | ICD-10-CM | POA: Diagnosis not present

## 2021-09-10 DIAGNOSIS — R410 Disorientation, unspecified: Secondary | ICD-10-CM

## 2021-09-10 LAB — COMPREHENSIVE METABOLIC PANEL
ALT: 22 U/L (ref 0–44)
AST: 26 U/L (ref 15–41)
Albumin: 4.1 g/dL (ref 3.5–5.0)
Alkaline Phosphatase: 80 U/L (ref 38–126)
Anion gap: 10 (ref 5–15)
BUN: 17 mg/dL (ref 8–23)
CO2: 25 mmol/L (ref 22–32)
Calcium: 9.7 mg/dL (ref 8.9–10.3)
Chloride: 99 mmol/L (ref 98–111)
Creatinine, Ser: 1.04 mg/dL (ref 0.61–1.24)
GFR, Estimated: >60 mL/min (ref >60)
Glucose, Bld: 131 mg/dL — ABNORMAL HIGH (ref 70–99)
Potassium: 3.3 mmol/L — ABNORMAL LOW (ref 3.5–5.1)
Sodium: 134 mmol/L — ABNORMAL LOW (ref 135–145)
Total Bilirubin: 2 mg/dL — ABNORMAL HIGH (ref 0.3–1.2)
Total Protein: 7.3 g/dL (ref 6.5–8.1)

## 2021-09-10 LAB — URINALYSIS, COMPLETE (UACMP) WITH MICROSCOPIC
Bacteria, UA: NONE SEEN
Bilirubin Urine: NEGATIVE
Glucose, UA: NEGATIVE mg/dL
Ketones, ur: 5 mg/dL — AB
Leukocytes,Ua: NEGATIVE
Nitrite: NEGATIVE
Protein, ur: 30 mg/dL — AB
Specific Gravity, Urine: 1.01 (ref 1.005–1.030)
Squamous Epithelial / HPF: NONE SEEN (ref 0–5)
pH: 6 (ref 5.0–8.0)

## 2021-09-10 LAB — RESP PANEL BY RT-PCR (FLU A&B, COVID) ARPGX2
Influenza A by PCR: NEGATIVE
Influenza B by PCR: NEGATIVE
SARS Coronavirus 2 by RT PCR: NEGATIVE

## 2021-09-10 LAB — CK: Total CK: 412 U/L — ABNORMAL HIGH (ref 49–397)

## 2021-09-10 LAB — URINE DRUG SCREEN, QUALITATIVE (ARMC ONLY)
Amphetamines, Ur Screen: POSITIVE — AB
Barbiturates, Ur Screen: NOT DETECTED
Benzodiazepine, Ur Scrn: POSITIVE — AB
Cannabinoid 50 Ng, Ur ~~LOC~~: NOT DETECTED
Cocaine Metabolite,Ur ~~LOC~~: NOT DETECTED
MDMA (Ecstasy)Ur Screen: NOT DETECTED
Methadone Scn, Ur: NOT DETECTED
Opiate, Ur Screen: POSITIVE — AB
Phencyclidine (PCP) Ur S: NOT DETECTED
Tricyclic, Ur Screen: NOT DETECTED

## 2021-09-10 LAB — TSH
TSH: 2.01 u[IU]/mL (ref 0.350–4.500)
TSH: 2.493 u[IU]/mL (ref 0.350–4.500)

## 2021-09-10 LAB — CBC
HCT: 42.3 % (ref 39.0–52.0)
Hemoglobin: 15 g/dL (ref 13.0–17.0)
MCH: 29.9 pg (ref 26.0–34.0)
MCHC: 35.5 g/dL (ref 30.0–36.0)
MCV: 84.4 fL (ref 80.0–100.0)
Platelets: 313 10*3/uL (ref 150–400)
RBC: 5.01 MIL/uL (ref 4.22–5.81)
RDW: 12.5 % (ref 11.5–15.5)
WBC: 12.8 10*3/uL — ABNORMAL HIGH (ref 4.0–10.5)
nRBC: 0 % (ref 0.0–0.2)

## 2021-09-10 LAB — AMMONIA: Ammonia: 19 umol/L (ref 9–35)

## 2021-09-10 LAB — ETHANOL: Alcohol, Ethyl (B): <10 mg/dL (ref <10)

## 2021-09-10 LAB — CBG MONITORING, ED: Glucose-Capillary: 110 mg/dL — ABNORMAL HIGH (ref 70–99)

## 2021-09-10 MED ORDER — POTASSIUM CHLORIDE 10 MEQ/100ML IV SOLN
10.0000 meq | INTRAVENOUS | Status: AC
  Administered 2021-09-10: 10 meq via INTRAVENOUS
  Filled 2021-09-10: qty 100

## 2021-09-10 MED ORDER — INSULIN ASPART 100 UNIT/ML IJ SOLN
0.0000 [IU] | Freq: Three times a day (TID) | INTRAMUSCULAR | Status: DC
  Administered 2021-09-11: 2 [IU] via SUBCUTANEOUS
  Filled 2021-09-10: qty 1

## 2021-09-10 MED ORDER — HYDRALAZINE HCL 20 MG/ML IJ SOLN: 5.0000 mg | Freq: Three times a day (TID) | INTRAMUSCULAR | Status: DC | PRN

## 2021-09-10 MED ORDER — ENALAPRIL MALEATE 10 MG PO TABS
5.0000 mg | ORAL_TABLET | Freq: Every day | ORAL | Status: DC
  Administered 2021-09-11: 5 mg via ORAL
  Filled 2021-09-10: qty 1

## 2021-09-10 MED ORDER — NEBIVOLOL HCL 10 MG PO TABS
10.0000 mg | ORAL_TABLET | Freq: Every day | ORAL | Status: DC
  Administered 2021-09-11: 10 mg via ORAL
  Filled 2021-09-10: qty 1

## 2021-09-10 MED ORDER — ALPRAZOLAM 0.5 MG PO TABS
0.5000 mg | ORAL_TABLET | Freq: Three times a day (TID) | ORAL | Status: DC
  Administered 2021-09-11 (×3): 0.5 mg via ORAL
  Filled 2021-09-10 (×3): qty 1

## 2021-09-10 MED ORDER — HALOPERIDOL LACTATE 5 MG/ML IJ SOLN
2.0000 mg | Freq: Once | INTRAMUSCULAR | Status: AC
  Administered 2021-09-10: 2 mg via INTRAVENOUS
  Filled 2021-09-10: qty 1

## 2021-09-10 MED ORDER — LORAZEPAM 2 MG/ML IJ SOLN
2.0000 mg | Freq: Once | INTRAMUSCULAR | Status: DC | PRN
  Administered 2021-09-10: 2 mg via INTRAVENOUS
  Filled 2021-09-10: qty 1

## 2021-09-10 MED ORDER — ONDANSETRON HCL 4 MG/2ML IJ SOLN: 4.0000 mg | Freq: Four times a day (QID) | INTRAMUSCULAR | Status: DC | PRN

## 2021-09-10 MED ORDER — INSULIN ASPART 100 UNIT/ML IJ SOLN: 0.0000 [IU] | Freq: Every day | INTRAMUSCULAR | Status: DC

## 2021-09-10 MED ORDER — ENOXAPARIN SODIUM 40 MG/0.4ML IJ SOSY: 40.0000 mg | PREFILLED_SYRINGE | Freq: Every day | INTRAMUSCULAR | Status: DC

## 2021-09-10 MED ORDER — ENSURE ENLIVE PO LIQD: 237.0000 mL | Freq: Two times a day (BID) | ORAL | Status: DC

## 2021-09-10 MED ORDER — AMLODIPINE BESYLATE 5 MG PO TABS
10.0000 mg | ORAL_TABLET | Freq: Every day | ORAL | Status: DC
  Administered 2021-09-11 (×2): 10 mg via ORAL
  Filled 2021-09-10 (×2): qty 2

## 2021-09-10 MED ORDER — TAMSULOSIN HCL 0.4 MG PO CAPS
0.4000 mg | ORAL_CAPSULE | Freq: Every day | ORAL | Status: DC
  Administered 2021-09-10 – 2021-09-11 (×2): 0.4 mg via ORAL
  Filled 2021-09-10: qty 1

## 2021-09-10 MED ORDER — ACETAMINOPHEN 325 MG PO TABS: 650.0000 mg | ORAL_TABLET | Freq: Four times a day (QID) | ORAL | Status: DC | PRN

## 2021-09-10 MED ORDER — ACETAMINOPHEN 325 MG RE SUPP: 650.0000 mg | Freq: Four times a day (QID) | RECTAL | Status: DC | PRN

## 2021-09-10 MED ORDER — ONDANSETRON HCL 4 MG PO TABS
4.0000 mg | ORAL_TABLET | Freq: Four times a day (QID) | ORAL | Status: DC | PRN
Start: 2021-09-10 — End: 2021-09-11
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
## 2021-09-11 ENCOUNTER — Other Ambulatory Visit: Payer: Self-pay

## 2021-09-11 ENCOUNTER — Encounter: Payer: Self-pay | Admitting: Emergency Medicine

## 2021-09-11 ENCOUNTER — Emergency Department: Payer: Medicare Other

## 2021-09-11 DIAGNOSIS — I10 Essential (primary) hypertension: Secondary | ICD-10-CM | POA: Insufficient documentation

## 2021-09-11 DIAGNOSIS — S40022A Contusion of left upper arm, initial encounter: Secondary | ICD-10-CM | POA: Insufficient documentation

## 2021-09-11 DIAGNOSIS — S8011XA Contusion of right lower leg, initial encounter: Secondary | ICD-10-CM | POA: Insufficient documentation

## 2021-09-11 DIAGNOSIS — S8012XA Contusion of left lower leg, initial encounter: Secondary | ICD-10-CM | POA: Insufficient documentation

## 2021-09-11 DIAGNOSIS — M25521 Pain in right elbow: Secondary | ICD-10-CM | POA: Insufficient documentation

## 2021-09-11 DIAGNOSIS — R404 Transient alteration of awareness: Secondary | ICD-10-CM | POA: Diagnosis not present

## 2021-09-11 DIAGNOSIS — Z79899 Other long term (current) drug therapy: Secondary | ICD-10-CM | POA: Insufficient documentation

## 2021-09-11 DIAGNOSIS — Y92410 Unspecified street and highway as the place of occurrence of the external cause: Secondary | ICD-10-CM | POA: Insufficient documentation

## 2021-09-11 DIAGNOSIS — S40021A Contusion of right upper arm, initial encounter: Secondary | ICD-10-CM | POA: Insufficient documentation

## 2021-09-11 DIAGNOSIS — D72829 Elevated white blood cell count, unspecified: Secondary | ICD-10-CM | POA: Diagnosis not present

## 2021-09-11 DIAGNOSIS — F1910 Other psychoactive substance abuse, uncomplicated: Secondary | ICD-10-CM | POA: Diagnosis present

## 2021-09-11 LAB — CBC
HCT: 39.2 % (ref 39.0–52.0)
HCT: 42.8 % (ref 39.0–52.0)
Hemoglobin: 14.1 g/dL (ref 13.0–17.0)
Hemoglobin: 15.1 g/dL (ref 13.0–17.0)
MCH: 31.1 pg (ref 26.0–34.0)
MCH: 30.5 pg (ref 26.0–34.0)
MCHC: 36 g/dL (ref 30.0–36.0)
MCHC: 35.3 g/dL (ref 30.0–36.0)
MCV: 86.3 fL (ref 80.0–100.0)
MCV: 86.5 fL (ref 80.0–100.0)
Platelets: 301 10*3/uL (ref 150–400)
Platelets: 329 10*3/uL (ref 150–400)
RBC: 4.54 MIL/uL (ref 4.22–5.81)
RBC: 4.95 MIL/uL (ref 4.22–5.81)
RDW: 12.4 % (ref 11.5–15.5)
RDW: 12.5 % (ref 11.5–15.5)
WBC: 11.1 10*3/uL — ABNORMAL HIGH (ref 4.0–10.5)
WBC: 13 10*3/uL — ABNORMAL HIGH (ref 4.0–10.5)
nRBC: 0 % (ref 0.0–0.2)
nRBC: 0 % (ref 0.0–0.2)

## 2021-09-11 LAB — COMPREHENSIVE METABOLIC PANEL
ALT: 26 U/L (ref 0–44)
AST: 35 U/L (ref 15–41)
Albumin: 3.8 g/dL (ref 3.5–5.0)
Alkaline Phosphatase: 77 U/L (ref 38–126)
Anion gap: 12 (ref 5–15)
BUN: 13 mg/dL (ref 8–23)
CO2: 24 mmol/L (ref 22–32)
Calcium: 9.2 mg/dL (ref 8.9–10.3)
Chloride: 104 mmol/L (ref 98–111)
Creatinine, Ser: 0.8 mg/dL (ref 0.61–1.24)
GFR, Estimated: >60 mL/min (ref >60)
Glucose, Bld: 120 mg/dL — ABNORMAL HIGH (ref 70–99)
Potassium: 3.2 mmol/L — ABNORMAL LOW (ref 3.5–5.1)
Sodium: 140 mmol/L (ref 135–145)
Total Bilirubin: 2.6 mg/dL — ABNORMAL HIGH (ref 0.3–1.2)
Total Protein: 7 g/dL (ref 6.5–8.1)

## 2021-09-11 LAB — MAGNESIUM: Magnesium: 1.9 mg/dL (ref 1.7–2.4)

## 2021-09-11 LAB — BASIC METABOLIC PANEL
Anion gap: 11 (ref 5–15)
BUN: 12 mg/dL (ref 8–23)
CO2: 23 mmol/L (ref 22–32)
Calcium: 9.1 mg/dL (ref 8.9–10.3)
Chloride: 105 mmol/L (ref 98–111)
Creatinine, Ser: 0.74 mg/dL (ref 0.61–1.24)
GFR, Estimated: >60 mL/min (ref >60)
Glucose, Bld: 102 mg/dL — ABNORMAL HIGH (ref 70–99)
Potassium: 2.9 mmol/L — ABNORMAL LOW (ref 3.5–5.1)
Sodium: 139 mmol/L (ref 135–145)

## 2021-09-11 LAB — CBG MONITORING, ED
Glucose-Capillary: 89 mg/dL (ref 70–99)
Glucose-Capillary: 89 mg/dL (ref 70–99)
Glucose-Capillary: 149 mg/dL — ABNORMAL HIGH (ref 70–99)

## 2021-09-11 LAB — PROCALCITONIN: Procalcitonin: <0.1 ng/mL

## 2021-09-11 LAB — HEMOGLOBIN A1C
Hgb A1c MFr Bld: 6.8 % — ABNORMAL HIGH (ref 4.8–5.6)
Mean Plasma Glucose: 148 mg/dL

## 2021-09-11 LAB — VITAMIN B12: Vitamin B-12: 327 pg/mL (ref 180–914)

## 2021-09-11 MED ORDER — DOXYCYCLINE HYCLATE 100 MG PO TABS: 100.0000 mg | ORAL_TABLET | Freq: Two times a day (BID) | ORAL | 0 refills | Status: DC

## 2021-09-11 MED ORDER — POTASSIUM CHLORIDE CRYS ER 20 MEQ PO TBCR
40.0000 meq | EXTENDED_RELEASE_TABLET | ORAL | Status: AC
Start: 2021-09-11 — End: 2021-09-11
  Administered 2021-09-11 (×2): 40 meq via ORAL
  Filled 2021-09-11 (×2): qty 2

## 2021-09-11 MED ORDER — POTASSIUM CHLORIDE 10 MEQ/100ML IV SOLN
10.0000 meq | INTRAVENOUS | Status: AC
  Administered 2021-09-11 (×4): 10 meq via INTRAVENOUS
  Filled 2021-09-11 (×4): qty 100

## 2021-09-11 MED ORDER — ALPRAZOLAM 0.5 MG PO TABS: 0.5000 mg | ORAL_TABLET | Freq: Three times a day (TID) | ORAL | Status: DC | PRN

## 2021-09-11 MED ORDER — LACTATED RINGERS IV BOLUS
1000.0000 mL | Freq: Once | INTRAVENOUS | Status: AC
  Administered 2021-09-11: 1000 mL via INTRAVENOUS

## 2021-09-11 MED ORDER — TAMSULOSIN HCL 0.4 MG PO CAPS: 0.4000 mg | ORAL_CAPSULE | Freq: Every day | ORAL | Status: AC

## 2021-09-11 NOTE — ED Notes (Signed)
Pt for discharge , waiting for ride home. Seen in room AAOx2, in NAD . IV access has been removed

## 2021-09-11 NOTE — ED Notes (Signed)
Pt gave verbal permission for Santa Lighter to receive information over the phone. Also, call Ms. Hooks when pt is discharged for a ride.
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
## 2021-09-11 NOTE — ED Notes (Signed)
Pt sleeping since first morning assessment at 7 am- is breathing comfortably but SPO2 occasionally drops into lower 90's when sleeping occasionally due to snoring.
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
## 2021-09-11 NOTE — ED Notes (Signed)
Patient Niece Marylu Lund called and would like to speak to a Child psychotherapist.  She believes that He is doing illegal drugs with the daughter n law.

## 2021-09-12 ENCOUNTER — Emergency Department
Admission: EM | Admit: 2021-09-12 | Discharge: 2021-09-12 | Disposition: A | Discharge status: Home / Self Care | Payer: Medicare Other | Source: Home / Self Care | Attending: Emergency Medicine | Admitting: Emergency Medicine

## 2021-09-12 DIAGNOSIS — M25521 Pain in right elbow: Secondary | ICD-10-CM

## 2021-09-12 LAB — URINE CULTURE: Culture: 40000 — AB

## 2021-09-12 MED ORDER — ACETAMINOPHEN 500 MG PO TABS
1000.0000 mg | ORAL_TABLET | Freq: Once | ORAL | Status: AC
  Administered 2021-09-12: 1000 mg via ORAL
  Filled 2021-09-12: qty 2

## 2021-09-12 NOTE — Discharge Instructions (Addendum)
You Lobb take Tylenol 1000 mg every 6 hours as needed for pain.  Your x-ray showed no fracture or dislocation today.  Please follow-up with your primary care doctor if symptoms continue.

## 2021-09-12 NOTE — ED Notes (Signed)
Pt left the ED at this time with paperwork in hand. Pt was alert, oriented and ambulatory and stated to staff that he had a ride. Pt left on his own accord and family was contacted for potential ride.
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
## 2021-09-15 LAB — VITAMIN B1: Vitamin B1 (Thiamine): 199.2 nmol/L (ref 66.5–200.0)

## 2021-09-16 LAB — CULTURE, BLOOD (ROUTINE X 2)
Culture: NO GROWTH
Culture: NO GROWTH
Special Requests: ADEQUATE
Special Requests: ADEQUATE

## 2021-10-19 ENCOUNTER — Other Ambulatory Visit: Payer: Self-pay

## 2021-10-19 ENCOUNTER — Emergency Department
Admission: EM | Admit: 2021-10-19 | Discharge: 2021-10-20 | Disposition: A | Discharge status: Home / Self Care | Payer: Medicare Other | Attending: Emergency Medicine | Admitting: Emergency Medicine

## 2021-10-19 DIAGNOSIS — R4182 Altered mental status, unspecified: Secondary | ICD-10-CM | POA: Insufficient documentation

## 2021-10-19 DIAGNOSIS — T402X1A Poisoning by other opioids, accidental (unintentional), initial encounter: Secondary | ICD-10-CM | POA: Diagnosis not present

## 2021-10-19 DIAGNOSIS — Y92410 Unspecified street and highway as the place of occurrence of the external cause: Secondary | ICD-10-CM | POA: Diagnosis not present

## 2021-10-19 DIAGNOSIS — Z7984 Long term (current) use of oral hypoglycemic drugs: Secondary | ICD-10-CM | POA: Diagnosis not present

## 2021-10-19 DIAGNOSIS — R079 Chest pain, unspecified: Secondary | ICD-10-CM | POA: Insufficient documentation

## 2021-10-19 DIAGNOSIS — R109 Unspecified abdominal pain: Secondary | ICD-10-CM | POA: Diagnosis not present

## 2021-10-19 DIAGNOSIS — Z79899 Other long term (current) drug therapy: Secondary | ICD-10-CM | POA: Diagnosis not present

## 2021-10-19 DIAGNOSIS — R4781 Slurred speech: Secondary | ICD-10-CM | POA: Diagnosis not present

## 2021-10-19 DIAGNOSIS — I10 Essential (primary) hypertension: Secondary | ICD-10-CM | POA: Diagnosis not present

## 2021-10-19 DIAGNOSIS — T40604A Poisoning by unspecified narcotics, undetermined, initial encounter: Secondary | ICD-10-CM
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
## 2021-10-20 ENCOUNTER — Emergency Department: Payer: Medicare Other

## 2021-10-20 LAB — COMPREHENSIVE METABOLIC PANEL
ALT: 26 U/L (ref 0–44)
AST: 25 U/L (ref 15–41)
Albumin: 4.3 g/dL (ref 3.5–5.0)
Alkaline Phosphatase: 77 U/L (ref 38–126)
Anion gap: 9 (ref 5–15)
BUN: 23 mg/dL (ref 8–23)
CO2: 25 mmol/L (ref 22–32)
Calcium: 9 mg/dL (ref 8.9–10.3)
Chloride: 101 mmol/L (ref 98–111)
Creatinine, Ser: 1.34 mg/dL — ABNORMAL HIGH (ref 0.61–1.24)
GFR, Estimated: 54 mL/min — ABNORMAL LOW (ref >60)
Glucose, Bld: 243 mg/dL — ABNORMAL HIGH (ref 70–99)
Potassium: 3.5 mmol/L (ref 3.5–5.1)
Sodium: 135 mmol/L (ref 135–145)
Total Bilirubin: 1.2 mg/dL (ref 0.3–1.2)
Total Protein: 7.9 g/dL (ref 6.5–8.1)

## 2021-10-20 LAB — CBC WITH DIFFERENTIAL/PLATELET
Abs Immature Granulocytes: 0.1 10*3/uL — ABNORMAL HIGH (ref 0.00–0.07)
Basophils Absolute: 0.1 10*3/uL (ref 0.0–0.1)
Basophils Relative: 0 %
Eosinophils Absolute: 0 10*3/uL (ref 0.0–0.5)
Eosinophils Relative: 0 %
HCT: 41.6 % (ref 39.0–52.0)
Hemoglobin: 14.3 g/dL (ref 13.0–17.0)
Immature Granulocytes: 1 %
Lymphocytes Relative: 4 %
Lymphs Abs: 0.6 10*3/uL — ABNORMAL LOW (ref 0.7–4.0)
MCH: 29.9 pg (ref 26.0–34.0)
MCHC: 34.4 g/dL (ref 30.0–36.0)
MCV: 86.8 fL (ref 80.0–100.0)
Monocytes Absolute: 0.5 10*3/uL (ref 0.1–1.0)
Monocytes Relative: 4 %
Neutro Abs: 11.5 10*3/uL — ABNORMAL HIGH (ref 1.7–7.7)
Neutrophils Relative %: 91 %
Platelets: 289 10*3/uL (ref 150–400)
RBC: 4.79 MIL/uL (ref 4.22–5.81)
RDW: 12.1 % (ref 11.5–15.5)
WBC: 12.8 10*3/uL — ABNORMAL HIGH (ref 4.0–10.5)
nRBC: 0 % (ref 0.0–0.2)

## 2021-10-20 LAB — ETHANOL: Alcohol, Ethyl (B): <10 mg/dL (ref <10)

## 2021-10-20 LAB — CBG MONITORING, ED: Glucose-Capillary: 236 mg/dL — ABNORMAL HIGH (ref 70–99)

## 2021-10-20 MED ORDER — IOHEXOL 300 MG/ML  SOLN
100.0000 mL | Freq: Once | INTRAMUSCULAR | Status: AC | PRN
  Administered 2021-10-20: 100 mL via INTRAVENOUS
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
## 2021-10-20 NOTE — ED Notes (Signed)
RN to bedside. Pt sleeping.  
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
## 2021-12-20 ENCOUNTER — Other Ambulatory Visit: Payer: Self-pay

## 2022-06-25 ENCOUNTER — Emergency Department: Payer: Medicare Other

## 2022-06-25 ENCOUNTER — Emergency Department
Admission: EM | Admit: 2022-06-25 | Discharge: 2022-06-25 | Disposition: A | Discharge status: Home / Self Care | Payer: Medicare Other | Attending: Emergency Medicine | Admitting: Emergency Medicine

## 2022-06-25 DIAGNOSIS — M25561 Pain in right knee: Secondary | ICD-10-CM | POA: Diagnosis present

## 2022-06-25 DIAGNOSIS — W19XXXA Unspecified fall, initial encounter: Secondary | ICD-10-CM

## 2022-06-25 DIAGNOSIS — Z8616 Personal history of COVID-19: Secondary | ICD-10-CM | POA: Diagnosis not present

## 2022-06-25 DIAGNOSIS — R410 Disorientation, unspecified: Secondary | ICD-10-CM | POA: Insufficient documentation

## 2022-06-25 DIAGNOSIS — W1839XA Other fall on same level, initial encounter: Secondary | ICD-10-CM | POA: Diagnosis not present

## 2022-06-25 LAB — URINALYSIS, ROUTINE W REFLEX MICROSCOPIC
Bilirubin Urine: NEGATIVE
Glucose, UA: NEGATIVE mg/dL
Hgb urine dipstick: NEGATIVE
Ketones, ur: NEGATIVE mg/dL
Leukocytes,Ua: NEGATIVE
Nitrite: NEGATIVE
Protein, ur: NEGATIVE mg/dL
Specific Gravity, Urine: 1.008 (ref 1.005–1.030)
pH: 6 (ref 5.0–8.0)

## 2022-06-25 LAB — CBC WITH DIFFERENTIAL/PLATELET
Abs Immature Granulocytes: 0.11 10*3/uL — ABNORMAL HIGH (ref 0.00–0.07)
Basophils Absolute: 0.1 10*3/uL (ref 0.0–0.1)
Basophils Relative: 1 %
Eosinophils Absolute: 0.4 10*3/uL (ref 0.0–0.5)
Eosinophils Relative: 4 %
HCT: 46.9 % (ref 39.0–52.0)
Hemoglobin: 15.4 g/dL (ref 13.0–17.0)
Immature Granulocytes: 1 %
Lymphocytes Relative: 23 %
Lymphs Abs: 2 10*3/uL (ref 0.7–4.0)
MCH: 28.8 pg (ref 26.0–34.0)
MCHC: 32.8 g/dL (ref 30.0–36.0)
MCV: 87.7 fL (ref 80.0–100.0)
Monocytes Absolute: 0.8 10*3/uL (ref 0.1–1.0)
Monocytes Relative: 9 %
Neutro Abs: 5.4 10*3/uL (ref 1.7–7.7)
Neutrophils Relative %: 62 %
Platelets: 268 10*3/uL (ref 150–400)
RBC: 5.35 MIL/uL (ref 4.22–5.81)
RDW: 12.6 % (ref 11.5–15.5)
WBC: 8.7 10*3/uL (ref 4.0–10.5)
nRBC: 0 % (ref 0.0–0.2)

## 2022-06-25 LAB — COMPREHENSIVE METABOLIC PANEL
ALT: 34 U/L (ref 0–44)
AST: 33 U/L (ref 15–41)
Albumin: 3.9 g/dL (ref 3.5–5.0)
Alkaline Phosphatase: 85 U/L (ref 38–126)
Anion gap: 7 (ref 5–15)
BUN: 18 mg/dL (ref 8–23)
CO2: 26 mmol/L (ref 22–32)
Calcium: 10 mg/dL (ref 8.9–10.3)
Chloride: 105 mmol/L (ref 98–111)
Creatinine, Ser: 0.96 mg/dL (ref 0.61–1.24)
GFR, Estimated: >60 mL/min (ref >60)
Glucose, Bld: 137 mg/dL — ABNORMAL HIGH (ref 70–99)
Potassium: 4.2 mmol/L (ref 3.5–5.1)
Sodium: 138 mmol/L (ref 135–145)
Total Bilirubin: 0.9 mg/dL (ref 0.3–1.2)
Total Protein: 7.6 g/dL (ref 6.5–8.1)

## 2022-06-25 LAB — TROPONIN I (HIGH SENSITIVITY): Troponin I (High Sensitivity): 9 ng/L (ref <18)

## 2022-06-25 LAB — CK: Total CK: 69 U/L (ref 49–397)

## 2022-06-25 NOTE — ED Notes (Signed)
Pt assisted to toilet min assist  

## 2022-06-25 NOTE — ED Notes (Signed)
Pt family calling stating that patient seems confused, family on the phone stating they appear disoriented today and recently.

## 2022-06-25 NOTE — ED Provider Triage Note (Signed)
Emergency Medicine Provider Triage Evaluation Note  Luke Haynes , a 81 y.o. male  was evaluated in triage.  Pt complains of right knee pain. Tripped and fell today and landed on right knee. No HS or LOC. No headache, vision changes, vomiting. No other injury. No AC  Review of Systems  Positive: Knee pain Negative: HA  Physical Exam  BP (!) 154/89 (BP Location: Left Arm)   Pulse 71   Temp 97.6 F (36.4 C) (Oral)   Resp 17   Ht 5\' 6"  (1.676 m)   Wt 90.7 kg   SpO2 95%   BMI 32.28 kg/m  Gen:   Awake, no distress   Resp:  Normal effort  MSK:   Moves extremities without difficulty  Other:  No deformity, extensor mechanism intact, normal active and passive range of motion  Medical Decision Making  Medically screening exam initiated at 5:22 PM.  Appropriate orders placed.  Rondal Vandevelde Medeiros was informed that the remainder of the evaluation will be completed by another provider, this initial triage assessment does not replace that evaluation, and the importance of remaining in the ED until their evaluation is complete.     Beulah Gandy, PA-C 06/25/22 1724

## 2022-06-25 NOTE — ED Notes (Signed)
Dc ppw provided. Followup and RX information reviewed as applicable. pt declines VS at this time and provides verbal consent for dc at this time. Pt alert and oriented to lobby. 

## 2022-06-25 NOTE — ED Triage Notes (Signed)
First nurse note: Patient arrived [facility] from [facility]. Unwitnessed fall and found on floor by staff. Patient also reports he fell last night. Patient denies hitting head. Patient was able to stand and pivot to get on EMS stretcher. Patient c/o right knee pain. Patient also states "I have felt weak and really bad the last two weeks" EMS vitals 150/70b/p, HR 76, 98% RA

## 2022-06-25 NOTE — ED Provider Notes (Signed)
[facility] Provider Note  Patient Contact: 6:08 PM (approximate)   History   Knee Pain   HPI  Luke Haynes is a 81 y.o. male who presents the emergency department for evaluation of knee pain/knee injury.  There was some initial confusion on the exact nature of the patient's injury.  Patient reports that he has fallen twice in the last 2 days.  He states that both times it was mechanical primarily when he was twisting on his right knee.  He states that both time it gave out, he fell and landed on his knee.  He states that he was able to get himself out of the floor both times with no difficulty.  He denies hitting his head, losing consciousness.  Patient states that he has felt more weak generally since having COVID and a UTI earlier in the year.  However he denies any complaints such as fevers, chills, chest pain, shortness of breath, abdominal pain, urinary changes, diarrhea or constipation.  It appears that the confusion lays with EMS.  Patient arrived via EMS from a group home.  Reportedly patient had an unwitnessed fall per the group home staff and was found on the floor.  EMS reports that the patient Kalka have been in the floor overnight.  According to EMS the patient was only complaining of right knee pain.     Physical Exam   Triage Vital Signs: ED Triage Vitals  Enc Vitals Group     BP 06/25/22 1720 (!) 154/89     Pulse Rate 06/25/22 1720 71     Resp 06/25/22 1720 17     Temp 06/25/22 1720 97.6 F (36.4 C)     Temp Source 06/25/22 1720 Oral     SpO2 06/25/22 1720 95 %     Weight 06/25/22 1721 200 lb (90.7 kg)     Height 06/25/22 1721 5\' 6"  (1.676 m)     Head Circumference --      Peak Flow --      Pain Score 06/25/22 1721 7     Pain Loc --      Pain Edu? --      Excl. in [place]? --     Most recent vital signs: Vitals:   06/25/22 1720 06/25/22 1720  BP:  (!) 154/89  Pulse:  71  Resp:  17  Temp: 97.6 F (36.4 C) 97.6 F (36.4 C)  SpO2:  95%      General: Alert and in no acute distress. Head: No acute traumatic findings  Neck: No stridor. No cervical spine tenderness to palpation. Hematological/Lymphatic/Immunilogical: No cervical lymphadenopathy. Cardiovascular:  Good peripheral perfusion Respiratory: Normal respiratory effort without tachypnea or retractions. Lungs CTAB. Good air entry to the bases with no decreased or absent breath sounds. Gastrointestinal: Bowel sounds 4 quadrants. Soft and nontender to palpation. No guarding or rigidity. No palpable masses. No distention. No CVA tenderness. Musculoskeletal: Full range of motion to all extremities.  Slightly tender to palpation over the patella right knee.  There is no open wounds, ecchymosis, abrasions or lacerations.  Patient has good range of motion to the knee.  He still ambulatory and weightbearing on the right knee.  Special test were negative.  Dorsalis pedis pulses sensation intact distally. Neurologic:  No gross focal neurologic deficits are appreciated.  Skin:   No rash noted Other:   ED Results / Procedures / Treatments   Labs (all labs ordered are listed, but only abnormal results are  displayed) Labs Reviewed  COMPREHENSIVE METABOLIC PANEL - Abnormal; Notable for the following components:      Result Value   Glucose, Bld 137 (*)    All other components within normal limits  CBC WITH DIFFERENTIAL/PLATELET - Abnormal; Notable for the following components:   Abs Immature Granulocytes 0.11 (*)    All other components within normal limits  URINALYSIS, ROUTINE W REFLEX MICROSCOPIC - Abnormal; Notable for the following components:   Color, Urine YELLOW (*)    APPearance CLEAR (*)    All other components within normal limits  CK  TROPONIN I (HIGH SENSITIVITY)     EKG  ED ECG REPORT I, Delorise Royals Davene Jobin,  personally viewed and interpreted this ECG.   Date: 06/25/2022  EKG Time: 1846 hrs.  Rate: 66 bpm  Rhythm: unchanged from previous tracings,  normal sinus rhythm, no significant changes from previous EKG on 10/19/2021  Axis: Normal axis  Intervals:none  ST&T Change: No ST elevation or depression noted.  Normal sinus rhythm.  No STEMI.     RADIOLOGY  I personally viewed, evaluated, and interpreted these images as part of my medical decision making, as well as reviewing the written report by the radiologist.  ED Provider Interpretation: X-ray of the right knee reveals no acute traumatic findings.  Chest x-ray without cardiopulmonary findings.  CT scan of the head and cervical spine without acute traumatic findings.  DG Chest 2 View  Result Date: 06/25/2022 CLINICAL DATA:  Unwitnessed fall, found down EXAM: CHEST - 2 VIEW COMPARISON:  09/10/2021 FINDINGS: The heart size and mediastinal contours are within normal limits. Both lungs are clear. The visualized skeletal structures are unremarkable. IMPRESSION: No active cardiopulmonary disease. Electronically Signed   By: Helyn Numbers M.D.   On: 06/25/2022 18:49   CT Cervical Spine Wo Contrast  Result Date: 06/25/2022 CLINICAL DATA:  Unwitnessed fall, found down.  Polytrauma, blunt EXAM: CT CERVICAL SPINE WITHOUT CONTRAST TECHNIQUE: Multidetector CT imaging of the cervical spine was performed without intravenous contrast. Multiplanar CT image reconstructions were also generated. RADIATION DOSE REDUCTION: This exam was performed according to the departmental dose-optimization program which includes automated exposure control, adjustment of the mA and/or kV according to patient size and/or use of iterative reconstruction technique. COMPARISON:  None Available. FINDINGS: Alignment: Normal.  No listhesis. Skull base and vertebrae: Craniocervical alignment is normal. The is degenerative changes are seen at the atlantodental articulation with subchondral cyst formation within the odontoid process. The atlantodental interval, however, is not widened. There is degenerative calcification of the  cruciform ligament. No acute fracture of the cervical spine. Vertebral body height is preserved. Soft tissues and spinal canal: Posterior disc osteophyte complex at C6-7 results in minimal central canal stenosis with abutment but no remodeling of the thecal sac. AP diameter of the spinal canal is 9 mm at this level. The spinal canal is otherwise widely patent. No canal hematoma. No prevertebral soft tissue swelling. No paravertebral fluid collections. Atherosclerotic calcification noted within the carotid bifurcations bilaterally. No cervical adenopathy. Disc levels: There is intervertebral disc space narrowing and endplate remodeling throughout the cervical spine in keeping with DJD is of diffuse severe degenerative disc disease. Prevertebral soft tissues are not thickened on sagittal reformats. Multilevel uncovertebral and facet arthrosis results in multilevel mild-to-moderate neuroforaminal narrowing, most severe at C6-7 and, to a lesser extent, C5-6. Upper chest: Negative. Other: There is moderate mucosal thickening within the right maxillary sinus incidentally noted which appears atelectatic. There is fluid opacification of the right  mastoid air cells and middle ear cavities without associated osseous erosion. IMPRESSION: 1. No acute fracture or listhesis of the cervical spine. 2. 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 3. Right maxillary sinus disease. 4. Fluid opacification of the right middle ear cavity and mastoid air cells. Electronically Signed   By: Helyn Numbers M.D.   On: 06/25/2022 18:49   CT Head Wo Contrast  Result Date: 06/25/2022 CLINICAL DATA:  Trauma EXAM: CT HEAD WITHOUT CONTRAST TECHNIQUE: Contiguous axial images were obtained from the base of the skull through the vertex without intravenous  contrast. RADIATION DOSE REDUCTION: This exam was performed according to the departmental dose-optimization program which includes automated exposure control, adjustment of the mA and/or kV according to patient size and/or use of iterative reconstruction technique. COMPARISON:  10/20/2021 FINDINGS: Brain: No acute intracranial findings are seen. Cortical sulci are prominent. There is decreased density in periventricular and subcortical white matter. Vascular: Extensive arterial calcifications are seen. Skull: Unremarkable. Sinuses/Orbits: There is mucosal thickening in ethmoid and both maxillary sinuses, more so in the right maxillary sinus. There is fluid density in right mastoid air cells. Other: None. IMPRESSION: No acute intracranial findings are seen in noncontrast CT brain. Atrophy. Small-vessel disease. Atherosclerotic changes are noted with coarse arterial calcifications. Chronic sinusitis. Electronically Signed   By: Ernie Avena M.D.   On: 06/25/2022 18:41   DG Knee Complete 4 Views Right  Result Date: 06/25/2022 CLINICAL DATA:  A male age 51 presents following fall, injury to RIGHT knee. EXAM: RIGHT KNEE - COMPLETE 4+ VIEW COMPARISON:  None available FINDINGS: Trace effusion. Soft tissue swelling over the prepatellar aspect of the knee. Degenerative changes about the knee and chondrocalcinosis. Degenerative changes greatest in the medial and patellofemoral compartments. No visible fracture. No sign of dislocation. IMPRESSION: 1. Soft tissue swelling over the prepatellar aspect of the knee with trace effusion. No visible fracture. 2. Degenerative changes and chondrocalcinosis. Electronically Signed   By: Donzetta Kohut M.D.   On: 06/25/2022 18:05    PROCEDURES:  Critical Care performed: No  Procedures   MEDICATIONS ORDERED IN ED: Medications - No data to display   IMPRESSION / MDM / ASSESSMENT AND PLAN / ED COURSE  I reviewed the triage vital signs and the nursing notes.                               Differential diagnosis includes, but is not limited to, fall, knee fracture, patellar dislocation, quadriceps tear, head injury, intracranial hemorrhage, CVA, UTI, pneumonia, STEMI, NSTEMI  Patient's presentation is most consistent with acute presentation with potential threat to life or bodily function.   Patient's diagnosis is consistent with fall, knee injury.  Patient presents to the ED complaining of right knee pain after reportedly having 2 falls in the last 2 days.  Patient states that he has felt weak and not as steady since having COVID and had a UTI earlier this year.  Patient states that both of these falls were mechanical, that he did not hit his head or lose consciousness and that he was able to get himself out of the floor.  EMS reports that the patient had an unknown witnessed fall that could have occurred last night and there was potential for the patient to inflame in the floor.  As there was a difference in history, patient had imaging of his head, neck, chest and knee.  Imaging was reassuring.  Labs are reassuring at this time.  EKG is reassuring.  Patient has maintained his story throughout, is answering all questions appropriately.  I do not feel that patient requires further work-up and it does appear that patient's only complaint was right knee pain over his patella.  Suspect contusion in this area.  At this time patient is stable for discharge.. P follow-up primary care as needed.  Patient is given ED precautions to return to the ED for any worsening or new symptoms.       FINAL CLINICAL IMPRESSION(S) / ED DIAGNOSES   Final diagnoses:  Acute pain of right knee  Fall, initial encounter     Rx / DC Orders   ED Discharge Orders     None        Note:  This document was prepared using Dragon voice recognition software and Cobert include unintentional dictation errors.   Lanette Hampshire 06/25/22 2356    Georga Hacking,  MD 06/26/22 1742

## 2022-06-25 NOTE — ED Triage Notes (Addendum)
Pt to ED via [facility] from group home on [address] and unsure of name. Pt had unwitnessed fall and states he fell on his right knee. Pt denies LOC or hitting his head. Pt denies blood thinners. Pt was able to pivot while standing afterwards.

## 2023-03-13 ENCOUNTER — Other Ambulatory Visit: Payer: Self-pay

## 2023-03-13 ENCOUNTER — Emergency Department: Payer: 59

## 2023-03-13 ENCOUNTER — Emergency Department
Admission: EM | Admit: 2023-03-13 | Discharge: 2023-03-13 | Disposition: A | Discharge status: Home / Self Care | Payer: 59 | Attending: Emergency Medicine | Admitting: Emergency Medicine

## 2023-03-13 DIAGNOSIS — R9082 White matter disease, unspecified: Secondary | ICD-10-CM | POA: Diagnosis not present

## 2023-03-13 DIAGNOSIS — W06XXXA Fall from bed, initial encounter: Secondary | ICD-10-CM | POA: Diagnosis not present

## 2023-03-13 DIAGNOSIS — W19XXXA Unspecified fall, initial encounter: Secondary | ICD-10-CM

## 2023-03-13 DIAGNOSIS — M25552 Pain in left hip: Secondary | ICD-10-CM | POA: Diagnosis present

## 2023-03-13 LAB — URINALYSIS, ROUTINE W REFLEX MICROSCOPIC
Bilirubin Urine: NEGATIVE
Glucose, UA: NEGATIVE mg/dL
Hgb urine dipstick: NEGATIVE
Ketones, ur: NEGATIVE mg/dL
Leukocytes,Ua: NEGATIVE
Nitrite: NEGATIVE
Protein, ur: NEGATIVE mg/dL
Specific Gravity, Urine: 1.015 (ref 1.005–1.030)
pH: 6 (ref 5.0–8.0)

## 2023-03-13 LAB — CBC WITH DIFFERENTIAL/PLATELET
Abs Immature Granulocytes: 0.21 10*3/uL — ABNORMAL HIGH (ref 0.00–0.07)
Basophils Absolute: 0.1 10*3/uL (ref 0.0–0.1)
Basophils Relative: 1 %
Eosinophils Absolute: 0.1 10*3/uL (ref 0.0–0.5)
Eosinophils Relative: 1 %
HCT: 48.8 % (ref 39.0–52.0)
Hemoglobin: 16.5 g/dL (ref 13.0–17.0)
Immature Granulocytes: 1 %
Lymphocytes Relative: 9 %
Lymphs Abs: 1.7 10*3/uL (ref 0.7–4.0)
MCH: 29.1 pg (ref 26.0–34.0)
MCHC: 33.8 g/dL (ref 30.0–36.0)
MCV: 86.1 fL (ref 80.0–100.0)
Monocytes Absolute: 0.9 10*3/uL (ref 0.1–1.0)
Monocytes Relative: 5 %
Neutro Abs: 15 10*3/uL — ABNORMAL HIGH (ref 1.7–7.7)
Neutrophils Relative %: 83 %
Platelets: 306 10*3/uL (ref 150–400)
RBC: 5.67 MIL/uL (ref 4.22–5.81)
RDW: 12.1 % (ref 11.5–15.5)
WBC: 17.9 10*3/uL — ABNORMAL HIGH (ref 4.0–10.5)
nRBC: 0 % (ref 0.0–0.2)

## 2023-03-13 LAB — BASIC METABOLIC PANEL
Anion gap: 10 (ref 5–15)
BUN: 22 mg/dL (ref 8–23)
CO2: 23 mmol/L (ref 22–32)
Calcium: 10.1 mg/dL (ref 8.9–10.3)
Chloride: 103 mmol/L (ref 98–111)
Creatinine, Ser: 1.05 mg/dL (ref 0.61–1.24)
GFR, Estimated: >60 mL/min (ref >60)
Glucose, Bld: 134 mg/dL — ABNORMAL HIGH (ref 70–99)
Potassium: 3.7 mmol/L (ref 3.5–5.1)
Sodium: 136 mmol/L (ref 135–145)

## 2023-03-13 NOTE — Discharge Instructions (Signed)

## 2023-03-13 NOTE — ED Notes (Addendum)
Pt is up for discharge, called group home-niece Marcie Bal is in charge. Niece stated that Pt has been complaining of vertigo, has fallen 2 times today and is slurring his words. Passed information onto provider at this time. Pt ambulated to bathroom without assistance and is GCS 15 at this time

## 2023-03-13 NOTE — ED Triage Notes (Signed)
First Nurse Note:   BIB [facility] from group home. EMS called out for stroke alert. 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 No change from baseline per staff. Pt rolled out of his bed tonight and is c/o L hip pain. No shortening or rotation noted by EMS and EMS reports pt able to bear wt on L leg. Pt alert and oriented for EMS. EMS denies deficit in grip, strength, sensation, or speech with them.   CBG 140 126/58 HR 81 96% RA RR 19

## 2023-03-13 NOTE — ED Triage Notes (Signed)
See first nurse note. On RN assessment pt alert and oriented following commands. L droop noted to mouth and eye, per pt baseline. No aphasia noted, pt maintaining secretions. No loss of coordination or sensation noted. Pupils equal and reactive. Pt reports L hip pain. Denies hitting head or LOC. Denies h/a or dizziness. Denies vision change. Pt breathing unlabored speaking in full sentences with symmetric chest rise and fall.
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
## 2023-09-01 ENCOUNTER — Other Ambulatory Visit: Payer: Self-pay

## 2023-09-01 ENCOUNTER — Emergency Department: Payer: 59

## 2023-09-01 ENCOUNTER — Emergency Department
Admission: EM | Admit: 2023-09-01 | Discharge: 2023-09-01 | Disposition: A | Discharge status: Home / Self Care | Payer: 59 | Attending: Emergency Medicine | Admitting: Emergency Medicine

## 2023-09-01 DIAGNOSIS — Z20822 Contact with and (suspected) exposure to covid-19: Secondary | ICD-10-CM | POA: Insufficient documentation

## 2023-09-01 DIAGNOSIS — R531 Weakness: Secondary | ICD-10-CM | POA: Diagnosis present

## 2023-09-01 DIAGNOSIS — W19XXXA Unspecified fall, initial encounter: Secondary | ICD-10-CM | POA: Diagnosis not present

## 2023-09-01 DIAGNOSIS — R32 Unspecified urinary incontinence: Secondary | ICD-10-CM | POA: Insufficient documentation

## 2023-09-01 DIAGNOSIS — I10 Essential (primary) hypertension: Secondary | ICD-10-CM | POA: Insufficient documentation

## 2023-09-01 LAB — CBC
HCT: 42.7 % (ref 39.0–52.0)
Hemoglobin: 14.9 g/dL (ref 13.0–17.0)
MCH: 30.1 pg (ref 26.0–34.0)
MCHC: 34.9 g/dL (ref 30.0–36.0)
MCV: 86.3 fL (ref 80.0–100.0)
Platelets: 298 10*3/uL (ref 150–400)
RBC: 4.95 MIL/uL (ref 4.22–5.81)
RDW: 12.4 % (ref 11.5–15.5)
WBC: 16 10*3/uL — ABNORMAL HIGH (ref 4.0–10.5)
nRBC: 0 % (ref 0.0–0.2)

## 2023-09-01 LAB — BASIC METABOLIC PANEL
Anion gap: 10 (ref 5–15)
BUN: 13 mg/dL (ref 8–23)
CO2: 27 mmol/L (ref 22–32)
Calcium: 9.1 mg/dL (ref 8.9–10.3)
Chloride: 101 mmol/L (ref 98–111)
Creatinine, Ser: 0.81 mg/dL (ref 0.61–1.24)
GFR, Estimated: >60 mL/min (ref >60)
Glucose, Bld: 110 mg/dL — ABNORMAL HIGH (ref 70–99)
Potassium: 3.2 mmol/L — ABNORMAL LOW (ref 3.5–5.1)
Sodium: 138 mmol/L (ref 135–145)

## 2023-09-01 LAB — URINALYSIS, ROUTINE W REFLEX MICROSCOPIC
Bacteria, UA: NONE SEEN
Bilirubin Urine: NEGATIVE
Glucose, UA: NEGATIVE mg/dL
Hgb urine dipstick: NEGATIVE
Ketones, ur: NEGATIVE mg/dL
Nitrite: NEGATIVE
Protein, ur: NEGATIVE mg/dL
Specific Gravity, Urine: 1.011 (ref 1.005–1.030)
Squamous Epithelial / HPF: 0 /HPF (ref 0–5)
pH: 6 (ref 5.0–8.0)

## 2023-09-01 LAB — SARS CORONAVIRUS 2 BY RT PCR: SARS Coronavirus 2 by RT PCR: NEGATIVE

## 2023-09-01 MED ORDER — FOSFOMYCIN TROMETHAMINE 3 G PO PACK
3.0000 g | PACK | Freq: Once | ORAL | Status: AC
  Administered 2023-09-01: 3 g via ORAL
  Filled 2023-09-01: qty 3

## 2023-09-01 MED ORDER — SODIUM CHLORIDE 0.9 % IV BOLUS
1000.0000 mL | Freq: Once | INTRAVENOUS | Status: AC
  Administered 2023-09-01: 1000 mL via INTRAVENOUS
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
## 2023-09-01 NOTE — ED Provider Notes (Signed)
[facility] Provider Note    Event Date/Time   First MD Initiated Contact with Patient 09/01/23 1802     (approximate)  History   Chief Complaint: Fall and Weakness  HPI  Luke Haynes is a 82 y.o. male with a past medical history of hypertension who presents to the emergency department for weakness and falls.  According to the patient he has been feeling weak over the last several days he states a history of falls but he has been falling more.  Denies any focal weakness or numbness of any arm or leg.  States he believes he did hit his head when he fell today.  Patient states a history of urinary tract infections in the past for 5 times to which this feels similar.  Patient states he had a temperature of felt like he had a temperature earlier today but was 99.2 upon arrival.  Denies any vomiting or diarrhea.  States he has been experiencing some mild urinary incontinence at times which has happened before with urinary tract infections although denies any dysuria or hematuria.  Physical Exam   Triage Vital Signs: ED Triage Vitals [09/01/23 1708]  Encounter Vitals Group     BP (!) 144/75     Systolic BP Percentile      Diastolic BP Percentile      Pulse Rate 95     Resp 16     Temp 99.2 F (37.3 C)     Temp Source Oral     SpO2 98 %     Weight      Height      Head Circumference      Peak Flow      Pain Score 6     Pain Loc      Pain Education      Exclude from Growth Chart     Most recent vital signs: Vitals:   09/01/23 1708  BP: (!) 144/75  Pulse: 95  Resp: 16  Temp: 99.2 F (37.3 C)  SpO2: 98%    General: Awake, no distress.  CV:  Good peripheral perfusion.  Regular rate and rhythm  Resp:  Normal effort.  Equal breath sounds bilaterally.  Abd:  No distention.  Soft, nontender.  No rebound or guarding. Other:  Equal grip strength bilaterally 5/5 motor in upper and lower extremities.  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   ED Results / Procedures / Treatments   EKG  EKG viewed and interpreted by myself shows a sinus rhythm at 94 bpm with a narrow QRS, left axis deviation, largely normal intervals with nonspecific ST changes.  RADIOLOGY  I reviewed and interpreted CT head images.  No large bleed seen on my evaluation. CT scan head and C-spine read as negative.   MEDICATIONS ORDERED IN ED: Medications  sodium chloride 0.9 % bolus 1,000 mL (has no administration in time range)     IMPRESSION / MDM / ASSESSMENT AND PLAN / ED COURSE  I reviewed the triage vital signs and the nursing notes.  Patient's presentation is most consistent with acute presentation with potential threat to life or bodily function.  Patient presents to the emergency department for generalized weakness as well as increased falls and possible head injury.  We will check labs including urinalysis, COVID swab, obtain CT images of the head and C-spine.  Good range of motion all extremities no other concerns for traumatic injury.  We will IV hydrate and continue to closely monitor while awaiting results.  Patient agreeable to plan of care.  Patient's workup in the emergency department is overall reassuring, COVID is negative, chemistry reassuring, CBC shows leukocytosis of 16,000 however this is decreased from historical values.  Patient's urinalysis shows no bacteria.  Patient states his symptoms seem very suggestive of urinary tract infection he is concerned because often times he states they will start mild and then get worse.  As a precaution we will send urine culture and dose a one-time dose of fosfomycin.  Otherwise given the patient's reassuring lab work reassuring urinalysis reassuring CT imaging I believe the patient is safe for discharge home with outpatient follow-up.  Patient agreeable to plan of care.  FINAL CLINICAL IMPRESSION(S) / ED DIAGNOSES   Weakness Fall   Note:  This document was prepared  using Dragon voice recognition software and [place] include unintentional dictation errors.   Minna Antis, MD 09/01/23 2003

## 2023-09-01 NOTE — Discharge Instructions (Addendum)
Please drink plenty of fluids, follow-up with your doctor in the next 2 to 3 days for recheck/reevaluation.  Return to the emergency department for any fever, any further falls any increased weakness or any other symptom concerning to yourself

## 2023-09-01 NOTE — ED Provider Triage Note (Signed)
Emergency Medicine Provider Triage Evaluation Note  Luke Haynes , a 82 y.o. male  was evaluated in triage.  Patient fell off his bed and could not get up. Pt complains of concern about a UTI. Patient states he has had one before. He has pain in his right great toe. He feels like he is weak. Feels like he has had a fever at home.   Review of Systems  Positive: Toe pain Negative: Dysuria, frequency, urgency, CP, SOB  Physical Exam  There were no vitals taken for this visit. Gen:   Awake, no distress   Resp:  Normal effort  MSK:   Moves extremities without difficulty  Other:    Medical Decision Making  Medically screening exam initiated at 5:09 PM.  Appropriate orders placed.  Luke Haynes was informed that the remainder of the evaluation will be completed by another provider, this initial triage assessment does not replace that evaluation, and the importance of remaining in the ED until their evaluation is complete.     Cameron Ali, PA-C 09/01/23 1712

## 2023-09-01 NOTE — ED Notes (Signed)
Pt denied being able to produce a urine sample at this time. Pt provided with a labeled specimen cup and instructions to return cup to triage nurse desk once it has a clean catch urine sample.

## 2023-09-01 NOTE — ED Notes (Signed)
Shift Report given to Elmarie Shiley, RN

## 2023-11-06 ENCOUNTER — Ambulatory Visit: Payer: Self-pay | Admitting: Family Medicine

## 2023-11-23 ENCOUNTER — Encounter: Payer: Self-pay | Admitting: Podiatry

## 2023-11-23 ENCOUNTER — Ambulatory Visit (INDEPENDENT_AMBULATORY_CARE_PROVIDER_SITE_OTHER): Payer: 59 | Admitting: Podiatry

## 2023-11-23 VITALS — Ht 66.0 in | Wt 210.0 lb

## 2023-11-23 DIAGNOSIS — M79675 Pain in left toe(s): Secondary | ICD-10-CM | POA: Diagnosis not present

## 2023-11-23 DIAGNOSIS — M79674 Pain in right toe(s): Secondary | ICD-10-CM | POA: Diagnosis not present

## 2023-11-23 DIAGNOSIS — B351 Tinea unguium: Secondary | ICD-10-CM | POA: Diagnosis not present

## 2023-11-26 NOTE — Progress Notes (Signed)
Subjective: Luke Haynes presents today referred by Pcp, No for complaint of with chief concern of elongated, thickened, painful, discolored toenails for months. Patient has tried none. He is accompanied by his Niece on today's visit.  Past Medical History:  Diagnosis Date   Hypertension      Patient Active Problem List   Diagnosis Date Noted   Substance abuse (HCC) 09/11/2021   Altered mental status 09/10/2021   Leukocytosis 09/10/2021   Cystitis    Bilateral ankle pain    Sepsis secondary to UTI (HCC) 12/14/2019   Acute lower UTI 12/07/2019   AKI (acute kidney injury) (HCC) 12/07/2019   Dehydration 12/07/2019   Essential hypertension 12/07/2019   Hyponatremia 12/07/2019   Debility 12/07/2019   Rhabdomyolysis 12/07/2019   Elevated troponin 12/07/2019   Sepsis (HCC) 12/07/2019     History reviewed. No pertinent surgical history.   Current Outpatient Medications on File Prior to Visit  Medication Sig Dispense Refill   ALPRAZolam (XANAX) 0.5 MG tablet Take 1 tablet (0.5 mg total) by mouth 3 (three) times daily as needed for anxiety.     amLODipine (NORVASC) 10 MG tablet Take 10 mg by mouth daily.     enalapril (VASOTEC) 20 MG tablet Take 20 mg by mouth 2 (two) times daily.     feeding supplement, ENSURE ENLIVE, (ENSURE ENLIVE) LIQD Take 237 mLs by mouth 2 (two) times daily between meals. 237 mL 12   furosemide (LASIX) 20 MG tablet Take 20 mg by mouth daily.     HYDROcodone-acetaminophen (NORCO/VICODIN) 5-325 MG tablet Take 1 tablet by mouth every 8 (eight) hours.     metFORMIN (GLUCOPHAGE) 500 MG tablet Take 500 mg by mouth every morning.     nebivolol (BYSTOLIC) 5 MG tablet Take 5 mg by mouth daily.     pravastatin (PRAVACHOL) 20 MG tablet Take 20 mg by mouth every evening.     tamsulosin (FLOMAX) 0.4 MG CAPS capsule Take 1 capsule (0.4 mg total) by mouth daily. 30 capsule    No current facility-administered medications on file prior to visit.     Allergies  Allergen  Reactions   Sulfa Antibiotics      Social History   Occupational History   Not on file  Tobacco Use   Smoking status: Never   Smokeless tobacco: Never  Substance and Sexual Activity   Alcohol use: Not Currently   Drug use: Never   Sexual activity: Not on file     Family History  Problem Relation Age of Onset   COPD Father       There is no immunization history on file for this patient.   Objective: There were no vitals filed for this visit.  Luke Haynes is a pleasant 82 y.o. male in NAD. AAO x 3.  Vascular Examination: CFT <3 seconds b/l LE. Faintly palpable pedal pulses b/l. No pain with calf compression b/l. Lower extremity skin temperature gradient within normal limits. No edema noted b/l LE.  Neurological Examination: Sensation grossly intact b/l with 10 gram monofilament. Vibratory sensation intact b/l.   Dermatological Examination: Pedal skin with normal turgor, texture and tone b/l. Toenails 1-5 b/l thick, discolored, elongated with subungual debris and pain on dorsal palpation. No hyperkeratotic lesions noted b/l.   Musculoskeletal Examination: Muscle strength 5/5 to b/l LE. No pain, crepitus or joint limitation noted with ROM bilateral LE. No gross bony deformities bilaterally.  Radiographs: None  Assessment: 1. Pain due to onychomycosis of toenails of both feet  Plan: -Patient's family member present. All questions/concerns addressed on today's visit. -Patient to continue soft, supportive shoe gear daily. -Discussed treatment options for onychomycosis. Patient opted for toenail debridement only on today.  -Toenails 1-5 b/l were debrided in length and girth with sterile nail nippers and dremel without iatrogenic bleeding.  -Patient/POA to call should there be question/concern in the interim.  Return in about 3 months (around 02/21/2024).  Freddie Breech, DPM      [place] LOCATION: [address] [place] [address]                   Office [phone]   [facility] LOCATION: [address] [place], [place] [address] Office [phone]

## 2024-02-22 ENCOUNTER — Ambulatory Visit: Payer: 59 | Admitting: Podiatry

## 2024-02-29 ENCOUNTER — Ambulatory Visit (INDEPENDENT_AMBULATORY_CARE_PROVIDER_SITE_OTHER): Admitting: Podiatry

## 2024-02-29 ENCOUNTER — Encounter: Payer: Self-pay | Admitting: Podiatry

## 2024-02-29 VITALS — Ht 66.0 in | Wt 210.0 lb

## 2024-02-29 DIAGNOSIS — B351 Tinea unguium: Secondary | ICD-10-CM

## 2024-02-29 DIAGNOSIS — M79675 Pain in left toe(s): Secondary | ICD-10-CM | POA: Diagnosis not present

## 2024-02-29 DIAGNOSIS — M79674 Pain in right toe(s): Secondary | ICD-10-CM | POA: Diagnosis not present

## 2024-03-06 ENCOUNTER — Encounter: Payer: Self-pay | Admitting: Podiatry

## 2024-03-06 NOTE — Progress Notes (Signed)
  Subjective:  Patient ID: Luke Haynes, male    DOB: 1941/02/16,  MRN: [id number]  83 y.o. male presents with painful mycotic toenails x 10 which interfere with daily activities. Pain is relieved with periodic professional debridement. Chief Complaint  Patient presents with   Nail Problem    Pt is here for [facility] PCP is Dr Tawni Levy and unsure of LOV.     PCP: Koren Bound, NP.  New problem(s): None.   Review of Systems: Negative except as noted in the HPI.   Allergies  Allergen Reactions   Sulfa Antibiotics     Objective:  There were no vitals filed for this visit. Constitutional Patient is a pleasant 83 y.o. male in NAD. AAO x 3.  Vascular Capillary fill time to digits <3 seconds.  DP/PT pulse(s) are faintly palpable b/l lower extremities. Pedal hair absent b/l. Lower extremity skin temperature gradient warm to cool b/l. No pain with calf compression b/l. No cyanosis or clubbing noted. No ischemia nor gangrene noted b/l.   Neurologic Protective sensation intact 5/5 intact bilaterally with 10g monofilament b/l. Vibratory sensation intact b/l. No clonus b/l.   Dermatologic Pedal skin is thin, shiny and atrophic b/l.  No open wounds b/l lower extremities. No interdigital macerations b/l lower extremities. Toenails 1-5 b/l elongated, discolored, dystrophic, thickened, crumbly with subungual debris and tenderness to dorsal palpation. No corns, calluses nor porokeratotic lesions noted.  Orthopedic: Normal muscle strength 5/5 to all lower extremity muscle groups bilaterally. No pain, crepitus or joint limitation noted with ROM bilateral LE. No gross bony deformities bilaterally. Utilizes cane for ambulation assistance.   Last HgA1c:      No data to display           Assessment:   1. Pain due to onychomycosis of toenails of both feet    Plan:  Patient was evaluated and treated. All patient's and/or POA's questions/concerns addressed on today's visit. Mycotic toenails 1-5 debrided in  length and girth without incident. Continue soft, supportive shoe gear daily. Report any pedal injuries to medical professional. Call office if there are any quesitons/concerns. -Patient/POA to call should there be question/concern in the interim.  Return in about 3 months (around 05/31/2024).  Freddie Breech, DPM      [place] LOCATION: [address] [place] [address]                   Office [phone]   [facility] LOCATION: [address] [place], [place] [address] Office [phone]

## 2024-06-09 ENCOUNTER — Encounter: Payer: Self-pay | Admitting: Podiatry

## 2024-06-09 ENCOUNTER — Ambulatory Visit (INDEPENDENT_AMBULATORY_CARE_PROVIDER_SITE_OTHER): Admitting: Podiatry

## 2024-06-09 DIAGNOSIS — M79674 Pain in right toe(s): Secondary | ICD-10-CM

## 2024-06-09 DIAGNOSIS — M79675 Pain in left toe(s): Secondary | ICD-10-CM

## 2024-06-09 DIAGNOSIS — B351 Tinea unguium: Secondary | ICD-10-CM

## 2024-06-12 NOTE — Progress Notes (Signed)
  Subjective:  Patient ID: Luke Haynes, male    DOB: 05-25-1941,  MRN: [id number]  Luke Haynes presents to clinic today for painful thick toenails that are difficult to trim. Pain interferes with ambulation. Aggravating factors include wearing enclosed shoe gear. Pain is relieved with periodic professional debridement.  Chief Complaint  Patient presents with   Nail Problem    Thick painful toenails, 3 month follow up   New problem(s): None.   PCP is Caretha Barter, NP.  Allergies  Allergen Reactions   Sulfa Antibiotics     Review of Systems: Negative except as noted in the HPI.  Objective: No changes noted in today's physical examination. There were no vitals filed for this visit. Luke Haynes is a pleasant 83 y.o. male in NAD. AAO x 3.  Vascular Examination: CFT <3 seconds b/l. DP/PT pulses faintly palpable b/l. Skin temperature gradient warm to warm b/l. No pain with calf compression. No ischemia or gangrene. No cyanosis or clubbing noted b/l.    Neurological Examination: Sensation grossly intact b/l with 10 gram monofilament. Vibratory sensation intact b/l.   Dermatological Examination: Pedal skin warm and supple b/l.   No open wounds. No interdigital macerations.  Toenails 1-5 b/l thick, discolored, elongated with subungual debris and pain on dorsal palpation.    No hyperkeratotic nor porokeratotic lesions present on today's visit.  Musculoskeletal Examination: Muscle strength 5/5 to all lower extremity muscle groups bilaterally. No pain, crepitus or joint limitation noted with ROM b/l LE. No gross bony pedal deformities b/l. Patient ambulates independently without assistive aids.  Radiographs: None  Assessment/Plan: 1. Pain due to onychomycosis of toenails of both feet     Consent given for treatment. Patient examined. All patient's and/or POA's questions/concerns addressed on today's visit. Toenails 1-5 debrided in length and girth without incident. Continue  soft, supportive shoe gear daily. Report any pedal injuries to medical professional. Call office if there are any questions/concerns. -Patient/POA to call should there be question/concern in the interim.   Return in about 3 months (around 09/09/2024).  Delon LITTIE Merlin, DPM      [place] LOCATION: [address], [place] [address]                   Office [phone]   [facility] LOCATION: [address] [place], [place] [address] Office [phone]

## 2024-08-31 LAB — LAB REPORT - SCANNED
A1c: 5.6
EGFR: 85
Free T4: 0.88 ng/dL
HM Hepatitis Screen: POSITIVE
PSA, FREE: 0.43
PSA, Total: 1.2

## 2024-09-22 ENCOUNTER — Encounter: Payer: Self-pay | Admitting: Podiatry

## 2024-09-22 ENCOUNTER — Ambulatory Visit (INDEPENDENT_AMBULATORY_CARE_PROVIDER_SITE_OTHER): Admitting: Podiatry

## 2024-09-22 DIAGNOSIS — M79675 Pain in left toe(s): Secondary | ICD-10-CM | POA: Diagnosis not present

## 2024-09-22 DIAGNOSIS — M79674 Pain in right toe(s): Secondary | ICD-10-CM | POA: Diagnosis not present

## 2024-09-22 DIAGNOSIS — B351 Tinea unguium: Secondary | ICD-10-CM

## 2024-09-29 NOTE — Progress Notes (Signed)
  Subjective:  Patient ID: Mcclain Shall Apodaca, male    DOB: December 28, 1940,  MRN: [id number]  83 y.o. male presents to clinic with  painful mycotic toenails x 10 which interfere with daily activities. Pain is relieved with periodic professional debridement.  Chief Complaint  Patient presents with   Toe Pain    RFC. Denies being diabetic. Last visit was last week. PCP is [facility] [place]     New problem(s): None   PCP is Caretha Barter, NP.  Allergies  Allergen Reactions   Sulfa Antibiotics     Review of Systems: Negative except as noted in the HPI.   Objective:  Iran Rowe Renfro is a pleasant 83 y.o. male in NAD. AAO x 3.  Vascular Examination: CFT <3 seconds b/l LE. Faintly palpable pedal pulses b/l LE. Pedal hair absent b/l LE. Skin temperature gradient WNL b/l. No pain with calf compression b/l. No edema b/l LE. No cyanosis or clubbing noted b/l LE.  Neurological Examination: Sensation grossly intact b/l with 10 gram monofilament.   Dermatological Examination: Pedal skin with normal turgor, texture and tone b/l. Toenails 1-5 b/l thick, discolored, elongated with subungual debris and pain on dorsal palpation. No corns, calluses nor porokeratotic lesions noted.  Musculoskeletal Examination: Muscle strength 5/5 to b/l LE. No pain, crepitus or joint limitation noted with ROM bilateral LE. No gross bony deformities bilaterally.  Radiographs: None   Assessment:   1. Pain due to onychomycosis of toenails of both feet    Plan:  Patient was evaluated and treated. All patient's and/or POA's questions/concerns addressed on today's visit. Mycotic toenails 1-5 debrided in length and girth without incident. Continue soft, supportive shoe gear daily. Report any pedal injuries to medical professional. Call office if there are any quesitons/concerns. -Patient/POA to call should there be question/concern in the interim.  Return in about 3 months (around 12/23/2024).  Delon LITTIE Merlin, DPM      [place] LOCATION: [address] [place] [address]                   Office [phone]   [facility] LOCATION: [address] [place], [place] [address] Office [phone]

## 2024-10-13 ENCOUNTER — Ambulatory Visit: Attending: Infectious Diseases | Admitting: Infectious Diseases

## 2024-10-13 ENCOUNTER — Encounter: Payer: Self-pay | Admitting: Infectious Diseases

## 2024-10-13 ENCOUNTER — Other Ambulatory Visit
Admission: RE | Admit: 2024-10-13 | Discharge: 2024-10-13 | Disposition: A | Discharge status: Home / Self Care | Source: Ambulatory Visit | Attending: Infectious Diseases | Admitting: Infectious Diseases

## 2024-10-13 VITALS — BP 128/87 | HR 85 | Temp 97.1°F

## 2024-10-13 DIAGNOSIS — I10 Essential (primary) hypertension: Secondary | ICD-10-CM | POA: Insufficient documentation

## 2024-10-13 DIAGNOSIS — Z79899 Other long term (current) drug therapy: Secondary | ICD-10-CM | POA: Diagnosis not present

## 2024-10-13 DIAGNOSIS — B182 Chronic viral hepatitis C: Secondary | ICD-10-CM | POA: Diagnosis not present

## 2024-10-13 DIAGNOSIS — E119 Type 2 diabetes mellitus without complications: Secondary | ICD-10-CM | POA: Diagnosis not present

## 2024-10-13 DIAGNOSIS — Z7984 Long term (current) use of oral hypoglycemic drugs: Secondary | ICD-10-CM | POA: Insufficient documentation

## 2024-10-13 DIAGNOSIS — N400 Enlarged prostate without lower urinary tract symptoms: Secondary | ICD-10-CM | POA: Insufficient documentation

## 2024-10-13 DIAGNOSIS — F419 Anxiety disorder, unspecified: Secondary | ICD-10-CM | POA: Diagnosis not present

## 2024-10-13 LAB — CBC WITH DIFFERENTIAL/PLATELET
Abs Immature Granulocytes: 0.11 K/uL — ABNORMAL HIGH (ref 0.00–0.07)
Basophils Absolute: 0.1 K/uL (ref 0.0–0.1)
Basophils Relative: 1 %
Eosinophils Absolute: 0.6 K/uL — ABNORMAL HIGH (ref 0.0–0.5)
Eosinophils Relative: 6 %
HCT: 47.3 % (ref 39.0–52.0)
Hemoglobin: 16.1 g/dL (ref 13.0–17.0)
Immature Granulocytes: 1 %
Lymphocytes Relative: 21 %
Lymphs Abs: 2.2 K/uL (ref 0.7–4.0)
MCH: 28.9 pg (ref 26.0–34.0)
MCHC: 34 g/dL (ref 30.0–36.0)
MCV: 84.9 fL (ref 80.0–100.0)
Monocytes Absolute: 1 K/uL (ref 0.1–1.0)
Monocytes Relative: 9 %
Neutro Abs: 6.7 K/uL (ref 1.7–7.7)
Neutrophils Relative %: 62 %
Platelets: 354 K/uL (ref 150–400)
RBC: 5.57 MIL/uL (ref 4.22–5.81)
RDW: 11.8 % (ref 11.5–15.5)
WBC: 10.7 K/uL — ABNORMAL HIGH (ref 4.0–10.5)
nRBC: 0 % (ref 0.0–0.2)

## 2024-10-13 LAB — HEPATITIS A ANTIBODY, TOTAL: hep A Total Ab: NONREACTIVE

## 2024-10-13 LAB — COMPREHENSIVE METABOLIC PANEL WITH GFR
ALT: 17 U/L (ref 0–44)
AST: 21 U/L (ref 15–41)
Albumin: 3.9 g/dL (ref 3.5–5.0)
Alkaline Phosphatase: 70 U/L (ref 38–126)
Anion gap: 14 (ref 5–15)
BUN: 15 mg/dL (ref 8–23)
CO2: 28 mmol/L (ref 22–32)
Calcium: 9.5 mg/dL (ref 8.9–10.3)
Chloride: 95 mmol/L — ABNORMAL LOW (ref 98–111)
Creatinine, Ser: 1.04 mg/dL (ref 0.61–1.24)
GFR, Estimated: >60 mL/min (ref >60)
Glucose, Bld: 85 mg/dL (ref 70–99)
Potassium: 3.2 mmol/L — ABNORMAL LOW (ref 3.5–5.1)
Sodium: 137 mmol/L (ref 135–145)
Total Bilirubin: 0.6 mg/dL (ref 0.0–1.2)
Total Protein: 8.1 g/dL (ref 6.5–8.1)

## 2024-10-13 LAB — HEPATITIS B SURFACE ANTIGEN: Hepatitis B Surface Ag: NONREACTIVE

## 2024-10-13 LAB — HIV ANTIBODY (ROUTINE TESTING W REFLEX): HIV Screen 4th Generation wRfx: NONREACTIVE

## 2024-10-13 NOTE — Progress Notes (Unsigned)
 NAME: Luke Haynes  DOB: 06-13-1941  MRN: [id number]  Date/Time: 10/13/2024 10:35 AM  Subjective:  Pt is here with his niece Pt agrred to the use of AI SCRIBE ?  Luke Haynes is an 83 year old male who presents for evaluation and management of hepatitis C.  He was referred by Dr. Channing Knudsen for evaluation of hepatitis C.  He was recently diagnosed with hepatitis C following routine blood tests with a new PCP. The diagnosis was unexpected, and it is suspected that he contracted the virus approximately 20 years ago, possibly due to past intravenous drug use. His viral load is 1.7 million copies of the virus present in his blood.  He experiences low energy levels, which he attributes to old age, and has been using a cane for a few years due to unsteadiness, though he has not had any fractures. No current alcohol use, having last consumed alcohol a couple of years ago, and a history of drug use many years ago. He has not been a heavy drinker and has not consumed alcohol heavily in the past.  His current medications include clonazepam 0.5 mg twice daily for anxiety, losartan 50 mg once daily for hypertension, metoprolol 25 mg once daily, nebivolol  10 mg, amlodipine  10 mg, furosemide  20 mg, metformin 500 mg, pravastatin 20 mg, tamsulosin  0.4 mg, and trazodone  for sleep. He occasionally uses a stool softener and takes BC powder about once a week.  He has a history of diabetes, hypertension, anxiety, and past substance use. He lives with a male companion. 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 He has experienced Bell's palsy during the COVID pandemic and has had a CT scan in 2024 due to increasing weakness and falls. He and his caregiver do not recall seeing a neurologist.  No blood in stool, vomiting blood, or significant abdominal bloating, though he feels his stomach is larger than usual. No significant weight loss and reports a good appetite.  Past  Medical History:  Diagnosis Date   Hypertension    DM Anxiety Past h/o substanbce use No past surgical history on file.  Social History   Socioeconomic History   Marital status: Divorced    Spouse name: Not on file   Number of children: Not on file   Years of education: Not on file   Highest education level: Not on file  Occupational History   Not on file  Tobacco Use   Smoking status: Never   Smokeless tobacco: Never  Substance and Sexual Activity   Alcohol use: Not Currently   Drug use: Never   Sexual activity: Not on file  Other Topics Concern   Not on file  Social History Narrative   Not on file   Social Drivers of Health   Financial Resource Strain: Not on file  Food Insecurity: Not on file  Transportation Needs: Not on file  Physical Activity: Not on file  Stress: Not on file  Social Connections: Not on file  Intimate Partner Violence: Not on file    Family History  Problem Relation Age of Onset   COPD Father    Allergies  Allergen Reactions   Sulfa Antibiotics      Current Outpatient Medications  Medication Sig Dispense Refill   amLODipine  (NORVASC ) 10 MG tablet Take 10 mg by mouth daily.     clonazePAM (KLONOPIN) 0.5 MG tablet SMARTSIG:Tablet(s) By Mouth     enalapril  (VASOTEC ) 20 MG tablet  Take 20 mg by mouth 2 (two) times daily.     feeding supplement, ENSURE ENLIVE, (ENSURE ENLIVE) LIQD Take 237 mLs by mouth 2 (two) times daily between meals. 237 mL 12   furosemide  (LASIX ) 20 MG tablet Take 20 mg by mouth daily.     HYDROcodone -acetaminophen  (NORCO/VICODIN) 5-325 MG tablet Take 1 tablet by mouth every 8 (eight) hours.     metFORMIN (GLUCOPHAGE) 500 MG tablet Take 500 mg by mouth every morning.     nebivolol  (BYSTOLIC ) 5 MG tablet Take 5 mg by mouth daily.     pravastatin (PRAVACHOL) 20 MG tablet Take 20 mg by mouth every evening.     tamsulosin  (FLOMAX ) 0.4 MG CAPS capsule Take 1 capsule (0.4 mg total) by mouth daily. 30 capsule    traZODone   (DESYREL ) 150 MG tablet Take 150 mg by mouth at bedtime.     No current facility-administered medications for this visit.     Abtx:  Anti-infectives (From admission, onward)    None       REVIEW OF SYSTEMS:  Const: negative fever, negative chills, negative weight loss Eyes: negative diplopia or visual changes, negative eye pain ENT: negative coryza, negative sore throat Resp: negative cough, hemoptysis, dyspnea Cards: negative for chest pain, palpitations, lower extremity edema GU: negative for frequency, dysuria and hematuria GI: Negative for abdominal pain, diarrhea, bleeding, constipation Skin: negative for rash and pruritus Heme: negative for easy bruising and gum/nose bleeding MS: weakness Walks with a walker Neurolo:negative for headaches, dizziness, vertigo, memory problems  Psych: negative for feelings of anxiety, depression  Endocrine: negative for thyroid , diabetes Allergy/Immunology- sulfa: Objective:  VITALS:  BP 128/87   Pulse 85   Temp (!) 97.1 F (36.2 C) (Temporal)   SpO2 94%   PHYSICAL EXAM:  General: Alert, cooperative, no distress, appears stated age.  Head: Normocephalic, without obvious abnormality, atraumatic. Eyes: Conjunctivae clear, anicteric sclerae. Pupils are equal ENT Nares normal. No drainage or sinus tenderness. Lips, mucosa, and tongue normal. No Thrush Neck: Supple, symmetrical, no adenopathy, thyroid : non tender no carotid bruit and no JVD. Back: No CVA tenderness. Lungs: Clear to auscultation bilaterally. No Wheezing or Rhonchi. No rales. Heart: Regular rate and rhythm, no murmur, rub or gallop. Abdomen: Soft, non-tender,not distended. Bowel sounds normal. No masses Extremities: atraumatic, no cyanosis. No edema. No clubbing Skin: No rashes or lesions. Or bruising Lymph: Cervical, supraclavicular normal. Neurologic: rt facial palsy Pertinent Labs Lab Results CBC    Component Value Date/Time   WBC 16.0 (H) 09/01/2023 1756    RBC 4.95 09/01/2023 1756   HGB 14.9 09/01/2023 1756   HCT 42.7 09/01/2023 1756   PLT 298 09/01/2023 1756   MCV 86.3 09/01/2023 1756   MCH 30.1 09/01/2023 1756   MCHC 34.9 09/01/2023 1756   RDW 12.4 09/01/2023 1756   LYMPHSABS 1.7 03/13/2023 0333   MONOABS 0.9 03/13/2023 0333   EOSABS 0.1 03/13/2023 0333   BASOSABS 0.1 03/13/2023 0333       Latest Ref Rng & Units 09/01/2023    5:56 PM 03/13/2023    3:33 AM 06/25/2022    6:39 PM  CMP  Glucose 70 - 99 mg/dL 889  865  862   BUN 8 - 23 mg/dL 13  22  18    Creatinine 0.61 - 1.24 mg/dL 9.18  8.94  9.03   Sodium 135 - 145 mmol/L 138  136  138   Potassium 3.5 - 5.1 mmol/L 3.2  3.7  4.2   Chloride  98 - 111 mmol/L 101  103  105   CO2 22 - 32 mmol/L 27  23  26    Calcium 8.9 - 10.3 mg/dL 9.1  89.8  89.9   Total Protein 6.5 - 8.1 g/dL   7.6   Total Bilirubin 0.3 - 1.2 mg/dL   0.9   Alkaline Phos 38 - 126 U/L   85   AST 15 - 41 U/L   33   ALT 0 - 44 U/L   34       Microbiology:   Lines and Device Date on insertion # of days DC  Central line     Foley     ETT      Tests result DATE comment  HEPC RNA     HEPC  Genotype     Hepatitis B profile     Hepatitis A status     PT/PTT     AST, ALT, Bilirubin     Albumin     creatinine     HB/Platelet     HIV     AFP     TSH     comorbidities      tobacco     alcohol     drug     NS5A resistance test     FIB 4 score     Current meds     Ultrasound Elastography     Preg Test     Vaccination HEPA/HEPB      Counseling done on the following: -Natural progression of hep c, transmission (avoid sharing personal hygiene equipment), prevention, risks of left untreated and treatment options  Partner testing -Avoid hepatotoxins like alcohol and excessive acetamaminphen (no more than 2 gram a day) -Avoid eating raw sea food -Risks of re-infection  -Hepatitis coinfection and vaccination for A/B  ( Pneumococcal vaccination in the cirrhotics )    #If indicated in the setting of  cirrhosis: - HCC screening with US  /AFP every 6 months - EGD to r/o varices in cirrhotics  Impression/Recommendation Chronic hepatitis C infection Chronic hepatitis C infection with a high viral load of approximately 1.7 million copies, likely acquired 20-55 years ago, possibly through past intravenous drug use. He experiences fatigue and decreased energy levels but shows no symptoms of cirrhosis. Absence of heavy alcohol use reduces the risk of cirrhosis. No drug-drug interactions with current medications and planned hepatitis C treatment [place]). - Order liver function tests, liver ultrasound, and alpha-fetoprotein ,genotype, to assess liver status and rule out cirrhosis. - Prescribe Epclusa for 12 weeks after liver assessment. - Coordinate with pharmacy for medication delivery. - Educate on the importance of adherence to the 12-week treatment regimen.? ? Anxiety - clonazepam, trazadone  Hypertension- enalapril , nebivolol , amlodipine   BPH -tamsulosin  ______________________________________ Discussed with patient, and niece Follow up after starting medication in 2 months

## 2024-10-13 NOTE — Progress Notes (Incomplete)
 NAME: Luke Haynes  DOB: [birth year]  MRN: [id number]  Date/Time: 10/13/2024 10:35 AM  Subjective:  Pt is here with his niece Pt agrred to the use of AI SCRIBE    Luke Haynes is an 83 year old male who presents for evaluation and management of hepatitis C.  He was referred by Dr. Channing Knudsen for evaluation of hepatitis C.  He was recently diagnosed with hepatitis C following routine blood tests with a new PCP. The diagnosis was unexpected, and it is suspected that he contracted the virus approximately 20 years ago, possibly due to past intravenous drug use. His viral load is 1.7 million copies of the virus present in his blood.  He experiences low energy levels, which he attributes to old age, and has been using a cane for a few years due to unsteadiness, though he has not had any fractures. No current alcohol use, having last consumed alcohol a couple of years ago, and a history of drug use many years ago. He has not been a heavy drinker and has not consumed alcohol heavily in the past.  His current medications include clonazepam 0.5 mg twice daily for anxiety, losartan 50 mg once daily for hypertension, metoprolol 25 mg once daily, nebivolol  10 mg, amlodipine  10 mg, furosemide  20 mg, metformin 500 mg, pravastatin 20 mg, tamsulosin  0.4 mg, and trazodone  for sleep. He occasionally uses a stool softener and takes BC powder about once a week.  He has a history of diabetes, hypertension, anxiety, and past substance use. He lives with a male companion. 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 He has experienced Bell's palsy during the COVID pandemic and has had a CT scan in 2024 due to increasing weakness and falls. He and his caregiver do not recall seeing a neurologist.  No blood in stool, vomiting blood, or significant abdominal bloating, though he feels his stomach is larger than usual. No significant weight loss and reports a good appetite.  Past  Medical History:  Diagnosis Date  . Hypertension    DM Anxiety Past h/o substanbce use No past surgical history on file.  Social History   Socioeconomic History  . Marital status: Divorced    Spouse name: Not on file  . Number of children: Not on file  . Years of education: Not on file  . Highest education level: Not on file  Occupational History  . Not on file  Tobacco Use  . Smoking status: Never  . Smokeless tobacco: Never  Substance and Sexual Activity  . Alcohol use: Not Currently  . Drug use: Never  . Sexual activity: Not on file  Other Topics Concern  . Not on file  Social History Narrative  . Not on file   Social Drivers of Health   Financial Resource Strain: Not on file  Food Insecurity: Not on file  Transportation Needs: Not on file  Physical Activity: Not on file  Stress: Not on file  Social Connections: Not on file  Intimate Partner Violence: Not on file    Family History  Problem Relation Age of Onset  . COPD Father    Allergies  Allergen Reactions  . Sulfa Antibiotics      Current Outpatient Medications  Medication Sig Dispense Refill  . amLODipine  (NORVASC ) 10 MG tablet Take 10 mg by mouth daily.    . clonazePAM (KLONOPIN) 0.5 MG tablet SMARTSIG:Tablet(s) By Mouth    . enalapril  (VASOTEC ) 20 MG  tablet Take 20 mg by mouth 2 (two) times daily.    . feeding supplement, ENSURE ENLIVE, (ENSURE ENLIVE) LIQD Take 237 mLs by mouth 2 (two) times daily between meals. 237 mL 12  . furosemide  (LASIX ) 20 MG tablet Take 20 mg by mouth daily.    . HYDROcodone -acetaminophen  (NORCO/VICODIN) 5-325 MG tablet Take 1 tablet by mouth every 8 (eight) hours.    . metFORMIN (GLUCOPHAGE) 500 MG tablet Take 500 mg by mouth every morning.    . nebivolol  (BYSTOLIC ) 5 MG tablet Take 5 mg by mouth daily.    . pravastatin (PRAVACHOL) 20 MG tablet Take 20 mg by mouth every evening.    . tamsulosin  (FLOMAX ) 0.4 MG CAPS capsule Take 1 capsule (0.4 mg total) by mouth daily. 30  capsule   . traZODone  (DESYREL ) 150 MG tablet Take 150 mg by mouth at bedtime.     No current facility-administered medications for this visit.     Abtx:  Anti-infectives (From admission, onward)    None       REVIEW OF SYSTEMS:  Const: negative fever, negative chills, negative weight loss Eyes: negative diplopia or visual changes, negative eye pain ENT: negative coryza, negative sore throat Resp: negative cough, hemoptysis, dyspnea Cards: negative for chest pain, palpitations, lower extremity edema GU: negative for frequency, dysuria and hematuria GI: Negative for abdominal pain, diarrhea, bleeding, constipation Skin: negative for rash and pruritus Heme: negative for easy bruising and gum/nose bleeding MS: weakness Walks with a walker Neurolo:negative for headaches, dizziness, vertigo, memory problems  Psych: negative for feelings of anxiety, depression  Endocrine: negative for thyroid , diabetes Allergy/Immunology- sulfa: Objective:  VITALS:  BP 128/87   Pulse 85   Temp (!) 97.1 F (36.2 C) (Temporal)   SpO2 94%   PHYSICAL EXAM:  General: Alert, cooperative, no distress, appears stated age.  Head: Normocephalic, without obvious abnormality, atraumatic. Eyes: Conjunctivae clear, anicteric sclerae. Pupils are equal ENT Nares normal. No drainage or sinus tenderness. Lips, mucosa, and tongue normal. No Thrush Neck: Supple, symmetrical, no adenopathy, thyroid : non tender no carotid bruit and no JVD. Back: No CVA tenderness. Lungs: Clear to auscultation bilaterally. No Wheezing or Rhonchi. No rales. Heart: Regular rate and rhythm, no murmur, rub or gallop. Abdomen: Soft, non-tender,not distended. Bowel sounds normal. No masses Extremities: atraumatic, no cyanosis. No edema. No clubbing Skin: No rashes or lesions. Or bruising Lymph: Cervical, supraclavicular normal. Neurologic: rt facial palsy Pertinent Labs Lab Results CBC    Component Value Date/Time   WBC 16.0  (H) 09/01/2023 1756   RBC 4.95 09/01/2023 1756   HGB 14.9 09/01/2023 1756   HCT 42.7 09/01/2023 1756   PLT 298 09/01/2023 1756   MCV 86.3 09/01/2023 1756   MCH 30.1 09/01/2023 1756   MCHC 34.9 09/01/2023 1756   RDW 12.4 09/01/2023 1756   LYMPHSABS 1.7 03/13/2023 0333   MONOABS 0.9 03/13/2023 0333   EOSABS 0.1 03/13/2023 0333   BASOSABS 0.1 03/13/2023 0333       Latest Ref Rng & Units 09/01/2023    5:56 PM 03/13/2023    3:33 AM 06/25/2022    6:39 PM  CMP  Glucose 70 - 99 mg/dL 889  865  862   BUN 8 - 23 mg/dL 13  22  18    Creatinine 0.61 - 1.24 mg/dL 9.18  8.94  9.03   Sodium 135 - 145 mmol/L 138  136  138   Potassium 3.5 - 5.1 mmol/L 3.2  3.7  4.2  Chloride 98 - 111 mmol/L 101  103  105   CO2 22 - 32 mmol/L 27  23  26    Calcium 8.9 - 10.3 mg/dL 9.1  89.8  89.9   Total Protein 6.5 - 8.1 g/dL   7.6   Total Bilirubin 0.3 - 1.2 mg/dL   0.9   Alkaline Phos 38 - 126 U/L   85   AST 15 - 41 U/L   33   ALT 0 - 44 U/L   34       Microbiology:   Lines and Device Date on insertion # of days DC  Central line     Foley     ETT      Tests result DATE comment  HEPC RNA     HEPC  Genotype     Hepatitis B profile     Hepatitis A status     PT/PTT     AST, ALT, Bilirubin     Albumin     creatinine     HB/Platelet     HIV     AFP     TSH     comorbidities      tobacco     alcohol     drug     NS5A resistance test     FIB 4 score     Current meds     Ultrasound Elastography     Preg Test     Vaccination HEPA/HEPB      Counseling done on the following: -Natural progression of hep c, transmission (avoid sharing personal hygiene equipment), prevention, risks of left untreated and treatment options  Partner testing -Avoid hepatotoxins like alcohol and excessive acetamaminphen (no more than 2 gram a day) -Avoid eating raw sea food -Risks of re-infection  -Hepatitis coinfection and vaccination for A/B  ( Pneumococcal vaccination in the cirrhotics )    #If indicated  in the setting of cirrhosis: - HCC screening with US  /AFP every 6 months - EGD to r/o varices in cirrhotics  Impression/Recommendation Chronic hepatitis C infection Chronic hepatitis C infection with a high viral load of approximately 1.7 million copies, likely acquired 20-55 years ago, possibly through past intravenous drug use. He experiences fatigue and decreased energy levels but shows no symptoms of cirrhosis. Absence of heavy alcohol use reduces the risk of cirrhosis. No drug-drug interactions with current medications and planned hepatitis C treatment [place]). - Order liver function tests, liver ultrasound, and alpha-fetoprotein test to assess liver status and rule out cirrhosis. - Prescribe Epclusa for 12 weeks after liver assessment. - Coordinate with pharmacy for medication delivery. - Educate on the importance of adherence to the 12-week treatment regimen.    Anxiety- ______________________________________ Discussed with patient, requesting provider Note:  This document was prepared using Dragon voice recognition software and [place] include unintentional dictation errors.

## 2024-10-13 NOTE — Patient Instructions (Signed)
 Today, we discussed your recent diagnosis of hepatitis C, which was discovered through routine blood tests. We reviewed your medical history, current medications, and symptoms. You reported feeling low energy levels, which you attributed to old age. We also discussed your past substance use and current health status.  YOUR PLAN:  -CHRONIC HEPATITIS C INFECTION: Hepatitis C is a liver infection caused by the hepatitis C virus. It can lead to liver damage over time. Your viral load is high, but you do not show symptoms of cirrhosis, which is severe liver scarring. We will conduct liver function tests, a liver ultrasound, and an alpha-fetoprotein test to assess your liver's condition. After these tests, you will start a 12-week treatment with Epclusa. It is important to take the medication as prescribed to effectively manage the infection.  INSTRUCTIONS:  Please complete the liver function tests, liver ultrasound, and alpha-fetoprotein test as soon as possible. Once these tests are done, we will start your 12-week treatment with Epclusa. Make sure to take the medication exactly as prescribed and coordinate with the pharmacy for delivery. Follow up with us  if you have any questions or concerns.

## 2024-10-14 LAB — HCV RNA QUANT
HCV Quantitative Log: 6.061 {Log_IU}/mL (ref >1.70)
HCV Quantitative: 1150000 [IU]/mL (ref >50)

## 2024-10-14 LAB — AFP TUMOR MARKER: AFP, Serum, Tumor Marker: 5.4 ng/mL (ref 0.0–6.4)

## 2024-10-14 LAB — HEPATITIS B SURFACE ANTIBODY, QUANTITATIVE: Hep B S AB Quant (Post): <3.5 m[IU]/mL — ABNORMAL LOW

## 2024-10-14 LAB — HEPATITIS B CORE ANTIBODY, TOTAL: HEP B CORE AB: NEGATIVE

## 2024-10-15 LAB — HCV FIBROSURE
ALPHA 2-MACROGLOBULINS, QN: 382 mg/dL — ABNORMAL HIGH (ref 110–276)
ALT (SGPT) P5P: 17 IU/L (ref 0–55)
Apolipoprotein A-1: 143 mg/dL (ref 101–178)
Bilirubin, Total: 0.4 mg/dL (ref 0.0–1.2)
Fibrosis Score: 0.65 — ABNORMAL HIGH (ref 0.00–0.21)
GGT: 31 IU/L (ref 0–65)
Haptoglobin: 178 mg/dL (ref 38–329)
Necroinflammat Activity Score: 0.1 (ref 0.00–0.17)

## 2024-10-15 LAB — HEPATITIS C GENOTYPE

## 2024-10-18 ENCOUNTER — Other Ambulatory Visit
Admission: RE | Admit: 2024-10-18 | Discharge: 2024-10-18 | Disposition: A | Discharge status: Home / Self Care | Source: Ambulatory Visit | Attending: Infectious Diseases | Admitting: Infectious Diseases

## 2024-10-18 DIAGNOSIS — B182 Chronic viral hepatitis C: Secondary | ICD-10-CM | POA: Insufficient documentation

## 2024-10-18 LAB — APTT: aPTT: 26 s (ref 24–36)

## 2024-10-19 ENCOUNTER — Other Ambulatory Visit (HOSPITAL_COMMUNITY): Payer: Self-pay

## 2024-10-19 ENCOUNTER — Ambulatory Visit: Payer: Self-pay | Admitting: Pharmacist

## 2024-10-19 NOTE — Progress Notes (Signed)
 No problem. Will reach out once we get the medication approved. Thanks!

## 2024-10-19 NOTE — Progress Notes (Signed)
 Would you like us  to wait on starting treatment until you see US  elastography results? I know that isn't scheduled until 11/03/24. Just let us  know - thanks!

## 2024-10-20 ENCOUNTER — Other Ambulatory Visit (HOSPITAL_COMMUNITY): Payer: Self-pay

## 2024-10-20 ENCOUNTER — Telehealth: Payer: Self-pay

## 2024-10-20 NOTE — Telephone Encounter (Signed)
 Submitted a Prior Authorization request to [employer] for EPCLUSA via Latent. Will update once we receive a response.  J Code: CPT:  PA ID: [id number]

## 2024-10-24 ENCOUNTER — Telehealth: Payer: Self-pay | Admitting: Pharmacist

## 2024-10-24 NOTE — Telephone Encounter (Signed)
 Dr. Fayette, insurance has denied Epclusa stating Mavyret is the preferred option. Seems like a great candidate for Mavyret if you agree. Only concern is pravastatin drug interaction that would require dose reducing by half to 10 mg daily. We can appeal the decision based on that DDI if you would like.   Alan Geralds, PharmD, CPP, BCIDP, AAHIVP Clinical Pharmacist Practitioner Infectious Diseases Clinical Pharmacist [facility]

## 2024-10-25 ENCOUNTER — Telehealth: Payer: Self-pay

## 2024-10-25 ENCOUNTER — Other Ambulatory Visit: Payer: Self-pay | Admitting: Pharmacist

## 2024-10-25 ENCOUNTER — Other Ambulatory Visit: Payer: Self-pay

## 2024-10-25 ENCOUNTER — Other Ambulatory Visit (HOSPITAL_COMMUNITY): Payer: Self-pay

## 2024-10-25 DIAGNOSIS — B182 Chronic viral hepatitis C: Secondary | ICD-10-CM

## 2024-10-25 MED ORDER — MAVYRET 100-40 MG PO TABS
3.0000 | ORAL_TABLET | Freq: Every day | ORAL | 1 refills | Status: DC
  Filled 2024-10-25: qty 84, 28d supply, fill #0
  Filled 2024-11-15: qty 84, 28d supply, fill #1

## 2024-10-25 NOTE — Progress Notes (Signed)
 Specialty Pharmacy Initiation Note   Sang Blount Imhof is a 83 y.o. male who will be followed by the specialty pharmacy service for RxSp Hepatitis C    Review of administration, indication, effectiveness, safety, potential side effects, storage/disposable, and missed dose instructions occurred today for patient's specialty medication(s) Glecaprevir-Pibrentasvir [facility])     Patient/Caregiver did not have any additional questions or concerns.   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 Patient will be evaluated at upcoming provider appointment to assess progress      Comply with lab assessments       Patient is on track. Patient will adhere to provider and/or lab appointments      Maintain optimal adherence to therapy       Patient is initiating therapy. Patient will be evaluated at upcoming provider appointment to assess progress         Alan PARAS Geralds Specialty Pharmacist

## 2024-10-25 NOTE — Addendum Note (Signed)
 Addended by: WADDELL ALAN PARAS on: 10/25/2024 03:13 PM   Modules accepted: Orders

## 2024-10-25 NOTE — Telephone Encounter (Signed)
 Received notification from [facility] regarding a prior authorization for Mavyret. Authorization has been APPROVED from 10/25/24 to 12/20/24.   Per test claim, copay for 28 days supply is $0.00  Patient can fill through [facility]: [phone]   Authorization # [id number]

## 2024-10-25 NOTE — Progress Notes (Signed)
 Specialty Pharmacy Initial Fill Coordination Note  Luke Haynes is a 83 y.o. male contacted today regarding initial fill of specialty medication(s) Glecaprevir-Pibrentasvir [facility])   Patient requested Courier to Provider Office   Delivery date: 10/27/24   Verified address: [address] [place] [address]   Medication will be filled on 10/26/24.   Patient is aware of 0.00 copayment.

## 2024-10-25 NOTE — Telephone Encounter (Signed)
 Sounds good - holding sounds great since I believe he's taking pravastatin for primary prevention.

## 2024-10-26 ENCOUNTER — Other Ambulatory Visit: Payer: Self-pay

## 2024-10-26 ENCOUNTER — Telehealth: Payer: Self-pay

## 2024-10-26 NOTE — Telephone Encounter (Signed)
 Spoke to patient's caregiver, Jan, regarding Mavyret prescription for chronic hepatitis C treatment. Discussed that the prescription is estimated to be delivered to [facility] clinic tomorrow (10/27/2024) and they will be notified once it arrives to come pick up the prescription. Counseled caregiver to give patient 3 tablets daily with food and to give around the same time each day. Counseled on the importance of adherence to ensure optimal treatment and duration of Mavyret will be for 8 weeks. After review of medications, Mavyret has a drug-interaction with pravastatin and caregiver was informed to hold pravastatin while on Mavyret therapy. Typical recommendation is to decrease the pravastatin by 50%, however, given that it appears the patient is on pravastatin for primary prevention, collective decision with provider was to hold therapy while on Mavyret. Also counseled on common side effects (e.g., headache, fatigue, nausea, upset stomach) which should subside after the first few weeks of therapy. Informed to reach out prior to starting any new medications so we can screen for potential drug-interactions and to reach out with any questions/concerns.   Thank you,  Feliciano Close, PharmD PGY2 Infectious Diseases Pharmacy Resident

## 2024-10-27 ENCOUNTER — Telehealth: Payer: Self-pay

## 2024-10-27 NOTE — Telephone Encounter (Signed)
 [facility] Patient Advocate Encounter  Patient's medications MAVYRET have been couriered to [facility] from [facility] and will be picked up .  Charmaine Sharps, CPhT Specialty Pharmacy Patient [facility] Phone: [phone] Fax:  [phone]

## 2024-11-03 ENCOUNTER — Other Ambulatory Visit (HOSPITAL_COMMUNITY): Payer: Self-pay

## 2024-11-03 ENCOUNTER — Ambulatory Visit
Admission: RE | Admit: 2024-11-03 | Discharge: 2024-11-03 | Disposition: A | Discharge status: Home / Self Care | Source: Ambulatory Visit | Attending: Infectious Diseases | Admitting: Infectious Diseases

## 2024-11-03 DIAGNOSIS — B182 Chronic viral hepatitis C: Secondary | ICD-10-CM

## 2024-11-08 ENCOUNTER — Ambulatory Visit

## 2024-11-14 ENCOUNTER — Other Ambulatory Visit (HOSPITAL_COMMUNITY): Payer: Self-pay

## 2024-11-15 ENCOUNTER — Other Ambulatory Visit (HOSPITAL_COMMUNITY): Payer: Self-pay

## 2024-11-15 ENCOUNTER — Ambulatory Visit

## 2024-11-15 ENCOUNTER — Other Ambulatory Visit: Payer: Self-pay

## 2024-11-15 NOTE — Progress Notes (Signed)
 Patient's refill has been scheduled in [place].

## 2024-11-15 NOTE — Progress Notes (Signed)
 Specialty Pharmacy Refill Coordination Note  Luke Haynes is a 83 y.o. male assessed today regarding refills of clinic administered specialty medication(s) Glecaprevir -Pibrentasvir  (Mavyret )   Clinic requested No data recorded  Delivery date: 11/21/24   Verified address: [address] [place] [address]   Medication will be filled on 11/18/24.

## 2024-11-17 ENCOUNTER — Ambulatory Visit
Admission: RE | Admit: 2024-11-17 | Discharge: 2024-11-17 | Attending: Infectious Diseases | Admitting: Infectious Diseases

## 2024-11-17 DIAGNOSIS — B182 Chronic viral hepatitis C: Secondary | ICD-10-CM | POA: Insufficient documentation

## 2024-11-18 ENCOUNTER — Other Ambulatory Visit: Payer: Self-pay

## 2024-11-21 ENCOUNTER — Telehealth: Payer: Self-pay

## 2024-11-21 NOTE — Telephone Encounter (Signed)
 [facility] Patient Advocate Encounter  Patient's medications Mavyret  have been couriered to [facility] from [facility] pharmacy and will be picked up 11/22/24 and taken to [facility] to be picked up by patient.  2nd Mavyret  box   Arland Hutchinson, CPhT Specialty Pharmacy Patient [facility] for [facility] Phone: [phone] Fax:  [phone]

## 2024-12-05 ENCOUNTER — Telehealth: Payer: Self-pay

## 2024-12-05 NOTE — Telephone Encounter (Signed)
 I spoke with patient's niece Clarita regarding picking up the patient's Hep C medication. Patient's niece was suppose to come to [facility] on 12/19 to pick up the medication. Patient's niece was unable to make it to the office. I will follow up with her tomorrow morning to see how many more doses the patient has once she goes to see the patient today.  Laymond Postle ONEIDA Ligas, CMA

## 2024-12-06 ENCOUNTER — Telehealth: Payer: Self-pay

## 2024-12-06 NOTE — Telephone Encounter (Signed)
 I spoke to the patient's niece and she stated when she went to check on the patient he informed her he has not been taking the medication. He states he stopped due to vomiting, nausea and unsteady gait while taking the medication. He has a follow up next week and we can discuss with him at the appointment. Patient is stating at his age he rather not do treatment.  Eisha Chatterjee ONEIDA Ligas, CMA

## 2024-12-06 NOTE — Telephone Encounter (Signed)
 error

## 2024-12-09 ENCOUNTER — Other Ambulatory Visit (HOSPITAL_COMMUNITY): Payer: Self-pay

## 2024-12-14 ENCOUNTER — Emergency Department
Admission: EM | Admit: 2024-12-14 | Discharge: 2024-12-14 | Disposition: A | Discharge status: Home / Self Care | Attending: Emergency Medicine | Admitting: Emergency Medicine

## 2024-12-14 ENCOUNTER — Other Ambulatory Visit: Payer: Self-pay

## 2024-12-14 DIAGNOSIS — I10 Essential (primary) hypertension: Secondary | ICD-10-CM | POA: Diagnosis not present

## 2024-12-14 DIAGNOSIS — S161XXA Strain of muscle, fascia and tendon at neck level, initial encounter: Secondary | ICD-10-CM | POA: Diagnosis not present

## 2024-12-14 DIAGNOSIS — X58XXXA Exposure to other specified factors, initial encounter: Secondary | ICD-10-CM | POA: Insufficient documentation

## 2024-12-14 DIAGNOSIS — S199XXA Unspecified injury of neck, initial encounter: Secondary | ICD-10-CM | POA: Diagnosis present

## 2024-12-14 MED ORDER — CYCLOBENZAPRINE HCL 10 MG PO TABS: 10.0000 mg | ORAL_TABLET | Freq: Three times a day (TID) | ORAL | 0 refills | Status: AC | PRN

## 2024-12-14 MED ORDER — CYCLOBENZAPRINE HCL 10 MG PO TABS
10.0000 mg | ORAL_TABLET | Freq: Once | ORAL | Status: AC
  Administered 2024-12-14: 10 mg via ORAL
  Filled 2024-12-14: qty 1

## 2024-12-14 MED ORDER — KETOROLAC TROMETHAMINE 30 MG/ML IJ SOLN
30.0000 mg | Freq: Once | INTRAMUSCULAR | Status: AC
  Administered 2024-12-14: 30 mg via INTRAMUSCULAR
  Filled 2024-12-14: qty 1

## 2024-12-14 MED ORDER — NAPROXEN 500 MG PO TBEC: 500.0000 mg | DELAYED_RELEASE_TABLET | Freq: Two times a day (BID) | ORAL | 0 refills | Status: AC

## 2024-12-14 NOTE — ED Provider Notes (Signed)
 "  [facility] Provider Note    Event Date/Time   First MD Initiated Contact with Patient 12/14/24 1617     (approximate)   History   Neck Pain    HPI  Luke Haynes is a 83 y.o. male    with a past medical history of Bell's palsy, chronic hepatitis C, delirium, , with no significant past medical history who presents to the ED complaining of neck pain  . According to the patient, he was brought by EMS due to neck pain that started 2 days ago.  Patient endorses pain limited rotation but no flexion.  Patient denies fever, chills, photophobia, nasal congestion, chest pain, abdominal pain, diarrhea or urinary symptoms.  Patient denies new focal deficits.  Patient endorses having hepatitis C and he suspended the treatment because of the side effects.  Patient is here by himself    Patient Active Problem List   Diagnosis Date Noted   Substance abuse (HCC) 09/11/2021   Altered mental status 09/10/2021   Leukocytosis 09/10/2021   Cystitis    Bilateral ankle pain    Sepsis secondary to UTI (HCC) 12/14/2019   Acute lower UTI 12/07/2019   AKI (acute kidney injury) 12/07/2019   Dehydration 12/07/2019   Essential hypertension 12/07/2019   Hyponatremia 12/07/2019   Debility 12/07/2019   Rhabdomyolysis 12/07/2019   Elevated troponin 12/07/2019   Sepsis (HCC) 12/07/2019     Physical Exam   Triage Vital Signs: ED Triage Vitals  Encounter Vitals Group     BP 12/14/24 1544 (!) 157/82     Girls Systolic BP Percentile --      Girls Diastolic BP Percentile --      Boys Systolic BP Percentile --      Boys Diastolic BP Percentile --      Pulse Rate 12/14/24 1544 (!) 56     Resp 12/14/24 1544 18     Temp 12/14/24 1544 97.9 F (36.6 C)     Temp src --      SpO2 12/14/24 1544 100 %     Weight 12/14/24 1542 180 lb (81.6 kg)     Height 12/14/24 1542 5' 6 (1.676 m)     Head Circumference --      Peak Flow --      Pain Score 12/14/24 1542 2     Pain Loc --       Pain Education --      Exclude from Growth Chart --     Most recent vital signs: Vitals:   12/14/24 1544  BP: (!) 157/82  Pulse: (!) 56  Resp: 18  Temp: 97.9 F (36.6 C)  SpO2: 100%     Physical Exam Vitals and nursing note reviewed. During triage patient was hypertensive, bradycardic.   General:          Awake, no distress.  Oriented x 4 Face: Right deviation of the labial commissure, ptosis of the right eye due to previous Bell's palsy. Neck: Skin is intact, tender to palpation in the midline and paraspinal muscles.  Rotation is limited by pain more to the left side.  Flexion is intact.  No nuchal rigidity, negative Kernig sign negative, Brudzinski sign CV:                  Good peripheral perfusion. Regular rate and rhythm. Resp:               Normal effort. no tachypnea.Equal breath sounds bilaterally.  Abd:                 No distention.  Soft nontender Other: Skin no petechia  or rash             ED Results / Procedures / Treatments   Labs (all labs ordered are listed, but only abnormal results are displayed) Labs Reviewed - No data to display  PROCEDURES:  Critical Care performed:   Procedures   MEDICATIONS ORDERED IN ED: Medications  cyclobenzaprine (FLEXERIL) tablet 10 mg (10 mg Oral Given 12/14/24 1658)  ketorolac (TORADOL) 30 MG/ML injection 30 mg (30 mg Intramuscular Given 12/14/24 1659)   Clinical Course as of 12/14/24 1712  Wed Dec 14, 2024  1709 Assess the patient after getting Toradol and Flexeril.  Patient endorses feeling better, he is able to move his neck.  Patient is ready to be discharge.  Patient is agreeable with the plan. [AE]    Clinical Course User Index [AE] Janit Kast, PA-C    IMPRESSION / MDM / ASSESSMENT AND PLAN / ED COURSE  I reviewed the triage vital signs and the nursing notes.  Differential diagnosis includes, but is not limited to, cervical strain,  cervical radiculopathy, migraine, unlikely meningitis  Patient's  presentation is most consistent with acute, uncomplicated illness.   Luke Haynes is a 83 y.o., male who presents today with history of 2 days of neck pain.  See HPI for further information.  On a physical exam patient was hypertensive and a bradycardic during triage.  Patient is oriented x 4.  No photophobia, face presence of right Bell's palsy.  Neck, no nuchal rigidity, negative Brudzinski sign, negative Kernig sign.  Rotation is limited by pain, left rotation is more painful.  Flexion is intact.  Tenderness to palpation of the cervical midline and cervical paraspinal muscles.  Cardiopulmonary is clear, abdomen soft, nontender.  Rest of physical exam is normal. Plan Toradol IM Flexeril Reassess After getting Toradol and Flexeril patient endorses feeling better able to move his neck.  Patient's diagnosis is consistent with cervical strain. I did not order any imaging or labs, physical exam is reassuring.  I did review the patient's allergies and medications.The patient is in stable and satisfactory condition for discharge home  Patient will be discharged home with prescriptions for Flexeril, naproxen.  I did advise patient to take Flexeril only at bedtime.  Patient is to follow up with PCP as needed or otherwise directed. Patient is given ED precautions to return to the ED for any worsening or new symptoms. Discussed plan of care with patient, answered all of patient's questions, patient agreeable to plan of care. Advised patient to take medications according to the instructions on the label. Discussed possible side effects of new medications. Patient verbalized understanding.  FINAL CLINICAL IMPRESSION(S) / ED DIAGNOSES   Final diagnoses:  Acute strain of neck muscle, initial encounter     Rx / DC Orders   ED Discharge Orders          Ordered    cyclobenzaprine (FLEXERIL) 10 MG tablet  3 times daily PRN        12/14/24 1711    naproxen (EC NAPROSYN) 500 MG EC tablet  2 times daily with  meals        12/14/24 1711             Note:  This document was prepared using Dragon voice recognition software and Sacra include unintentional dictation errors.   Janit,  Lorane, PA-C 12/14/24 1712    Goodman, Graydon, MD 12/14/24 1831  "

## 2024-12-14 NOTE — Discharge Instructions (Addendum)
 You have been diagnosed with cervical strain.  Please take Flexeril 1 tablet by mouth every 8 hours as needed.  Please take naproxen 1 tablet by mouth after breakfast and after dinner.  Please drink plenty of fluids.  Please come back to ED or go to your PCP if you have any symptoms or symptoms worsen.  Please call your doctor to have an appointment for your hepatitis C treatment.  It was a pleasure to help you today.  Yaeli Hartung, PA-C.

## 2024-12-14 NOTE — ED Triage Notes (Signed)
 Pt comes with neck pain for two days via EMS from home. Pt does have hx of bells palsy with facial droop that has been like that for several years.  Pt denies any injuries. Pt is A*OX4

## 2024-12-20 ENCOUNTER — Ambulatory Visit: Attending: Infectious Diseases | Admitting: Infectious Diseases

## 2024-12-20 ENCOUNTER — Encounter: Payer: Self-pay | Admitting: Infectious Diseases

## 2024-12-20 VITALS — BP 135/78 | HR 52 | Temp 97.2°F

## 2024-12-20 DIAGNOSIS — N400 Enlarged prostate without lower urinary tract symptoms: Secondary | ICD-10-CM | POA: Insufficient documentation

## 2024-12-20 DIAGNOSIS — E119 Type 2 diabetes mellitus without complications: Secondary | ICD-10-CM | POA: Diagnosis not present

## 2024-12-20 DIAGNOSIS — T375X6A Underdosing of antiviral drugs, initial encounter: Secondary | ICD-10-CM | POA: Insufficient documentation

## 2024-12-20 DIAGNOSIS — I10 Essential (primary) hypertension: Secondary | ICD-10-CM | POA: Insufficient documentation

## 2024-12-20 DIAGNOSIS — Z91128 Patient's intentional underdosing of medication regimen for other reason: Secondary | ICD-10-CM | POA: Insufficient documentation

## 2024-12-20 DIAGNOSIS — R2689 Other abnormalities of gait and mobility: Secondary | ICD-10-CM | POA: Insufficient documentation

## 2024-12-20 DIAGNOSIS — Z79899 Other long term (current) drug therapy: Secondary | ICD-10-CM | POA: Insufficient documentation

## 2024-12-20 DIAGNOSIS — F419 Anxiety disorder, unspecified: Secondary | ICD-10-CM | POA: Insufficient documentation

## 2024-12-20 DIAGNOSIS — F1991 Other psychoactive substance use, unspecified, in remission: Secondary | ICD-10-CM | POA: Diagnosis not present

## 2024-12-20 DIAGNOSIS — B182 Chronic viral hepatitis C: Secondary | ICD-10-CM | POA: Insufficient documentation

## 2024-12-20 DIAGNOSIS — B1920 Unspecified viral hepatitis C without hepatic coma: Secondary | ICD-10-CM | POA: Diagnosis present

## 2024-12-20 NOTE — Patient Instructions (Signed)
 You have stopped taking Mayvret after 5 days as it made you dizzy, falling, nauseous- we will see wether your insurance will cover epclusa which is 1 pill a day

## 2024-12-20 NOTE — Progress Notes (Signed)
 NAME: Luke Haynes  DOB: 11-15-1941  MRN: [id number]  Date/Time: 12/20/2024 11:01 AM  Subjective:   ? Here for follow up for HEPC with his niece   I last saw him 2 months ago and started on Mayvret but he oculd not tolerate the meds- =3 pills/day- HE had dizziness and falls and felt it was due to the new med though he takes other meds that can clearly contribute to that. HE stopped taking the medication after 5 days   Following taken from last visit  Luke Haynes is an 84 year old male who presents for evaluation and management of hepatitis C.  He was referred by Dr. Channing Knudsen for evaluation of hepatitis C.  He was recently diagnosed with hepatitis C following routine blood tests with a new PCP. The diagnosis was unexpected, and it is suspected that he contracted the virus approximately 20 years ago, possibly due to past intravenous drug use. His viral load is 1.7 million copies of the virus present in his blood.  He experiences low energy levels, which he attributes to old age, and has been using a cane for a few years due to unsteadiness, though he has not had any fractures. No current alcohol use, having last consumed alcohol a couple of years ago, and a history of drug use many years ago. He has not been a heavy drinker and has not consumed alcohol heavily in the past.  His current medications include clonazepam 0.5 mg twice daily for anxiety, losartan 50 mg once daily for hypertension, metoprolol 25 mg once daily, nebivolol  10 mg, amlodipine  10 mg, furosemide  20 mg, metformin 500 mg, pravastatin 20 mg, tamsulosin  0.4 mg, and trazodone  for sleep. He occasionally uses a stool softener and takes BC powder about once a week.  He has a history of diabetes, hypertension, anxiety, and past substance use. He lives with a male companion. 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 He has experienced Bell's palsy during the COVID pandemic and has had a CT  scan in 2024 due to increasing weakness and falls. He and his caregiver do not recall seeing a neurologist.  No blood in stool, vomiting blood, or significant abdominal bloating, though he feels his stomach is larger than usual. No significant weight loss and reports a good appetite.  Past Medical History:  Diagnosis Date   Hypertension    DM Anxiety Past h/o substanbce use No past surgical history on file.  Social History   Socioeconomic History   Marital status: Divorced    Spouse name: Not on file   Number of children: Not on file   Years of education: Not on file   Highest education level: Not on file  Occupational History   Not on file  Tobacco Use   Smoking status: Never   Smokeless tobacco: Never  Vaping Use   Vaping status: Never Used  Substance and Sexual Activity   Alcohol use: Not Currently   Drug use: Never   Sexual activity: Not on file  Other Topics Concern   Not on file  Social History Narrative   Not on file   Social Drivers of Health   Tobacco Use: Low Risk (12/20/2024)   Patient History    Smoking Tobacco Use: Never    Smokeless Tobacco Use: Never    Passive Exposure: Not on file  Financial Resource Strain: Not on file  Food Insecurity: Not on file  Transportation Needs: Not  on file  Physical Activity: Not on file  Stress: Not on file  Social Connections: Not on file  Intimate Partner Violence: Not on file  Depression (EYV7-0): Not on file  Alcohol Screen: Not on file  Housing: Not on file  Utilities: Not on file  Health Literacy: Not on file    Family History  Problem Relation Age of Onset   COPD Father    Allergies  Allergen Reactions   Sulfa Antibiotics      Current Outpatient Medications  Medication Sig Dispense Refill   ACCU-CHEK GUIDE TEST test strip SMARTSIG:Strip(s)     Accu-Chek Softclix Lancets lancets SMARTSIG:Topical     ALPRAZolam  (XANAX ) 0.5 MG tablet SMARTSIG:Tablet(s) By Mouth     amLODipine  (NORVASC ) 10 MG tablet  Take 10 mg by mouth daily.     cyclobenzaprine  (FLEXERIL ) 10 MG tablet Take 10 mg by mouth 2 (two) times daily as needed for muscle spasms.     enalapril  (VASOTEC ) 20 MG tablet Take 20 mg by mouth 2 (two) times daily.     feeding supplement, ENSURE ENLIVE, (ENSURE ENLIVE) LIQD Take 237 mLs by mouth 2 (two) times daily between meals. 237 mL 12   furosemide  (LASIX ) 20 MG tablet Take 20 mg by mouth daily.     metFORMIN (GLUCOPHAGE) 500 MG tablet Take 500 mg by mouth every morning.     naproxen  (EC NAPROSYN ) 500 MG EC tablet Take 1 tablet (500 mg total) by mouth 2 (two) times daily with a meal. 30 tablet 0   Nebivolol  HCl 20 MG TABS Take 1 tablet by mouth daily.     pravastatin (PRAVACHOL) 20 MG tablet Take 20 mg by mouth every evening.     tamsulosin  (FLOMAX ) 0.4 MG CAPS capsule Take 1 capsule (0.4 mg total) by mouth daily. 30 capsule    traZODone  (DESYREL ) 150 MG tablet Take 150 mg by mouth at bedtime.     No current facility-administered medications for this visit.     Abtx:  Anti-infectives (From admission, onward)    None       REVIEW OF SYSTEMS:  Const: negative fever, negative chills, negative weight loss Eyes: negative diplopia or visual changes, negative eye pain ENT: negative coryza, negative sore throat Resp: negative cough, hemoptysis, dyspnea Cards: negative for chest pain, palpitations, lower extremity edema GU: negative for frequency, dysuria and hematuria GI: Negative for abdominal pain, diarrhea, bleeding, constipation Skin: negative for rash and pruritus Heme: negative for easy bruising and gum/nose bleeding MS: weakness, dizziness  Walks with a walker Neurolo:negative for headaches, dizziness, vertigo, memory problems  Psych: negative for feelings of anxiety, depression  Endocrine: negative for thyroid , diabetes Allergy/Immunology- sulfa: Objective:  VITALS:  BP 135/78   Pulse (!) 52   Temp (!) 97.2 F (36.2 C) (Temporal)   SpO2 95%   PHYSICAL EXAM:   General: Alert, cooperative, no distress, appears stated age. In wheel chair Lungs: Clear to auscultation bilaterally. No Wheezing or Rhonchi. No rales. Heart: Regular rate and rhythm, no murmur, rub or gallop. Abdomen: Soft, non-tender,not distended. Bowel sounds normal. No masses Extremities: atraumatic, no cyanosis. No edema. No clubbing Skin: No rashes or lesions. Or bruising Lymph: Cervical, supraclavicular normal. Neurologic: rt facial palsy Pertinent Labs Lab Results CBC    Component Value Date/Time   WBC 10.7 (H) 10/13/2024 1153   RBC 5.57 10/13/2024 1153   HGB 16.1 10/13/2024 1153   HCT 47.3 10/13/2024 1153   PLT 354 10/13/2024 1153   MCV 84.9 10/13/2024  1153   MCH 28.9 10/13/2024 1153   MCHC 34.0 10/13/2024 1153   RDW 11.8 10/13/2024 1153   LYMPHSABS 2.2 10/13/2024 1153   MONOABS 1.0 10/13/2024 1153   EOSABS 0.6 (H) 10/13/2024 1153   BASOSABS 0.1 10/13/2024 1153       Latest Ref Rng & Units 10/13/2024   11:53 AM 09/01/2023    5:56 PM 03/13/2023    3:33 AM  CMP  Glucose 70 - 99 mg/dL 85  889  865   BUN 8 - 23 mg/dL 15  13  22    Creatinine 0.61 - 1.24 mg/dL 8.95  9.18  8.94   Sodium 135 - 145 mmol/L 137  138  136   Potassium 3.5 - 5.1 mmol/L 3.2  3.2  3.7   Chloride 98 - 111 mmol/L 95  101  103   CO2 22 - 32 mmol/L 28  27  23    Calcium 8.9 - 10.3 mg/dL 9.5  9.1  89.8   Total Protein 6.5 - 8.1 g/dL 8.1     Total Bilirubin 0.0 - 1.2 mg/dL 0.6     Alkaline Phos 38 - 126 U/L 70     AST 15 - 41 U/L 21     ALT 0 - 44 U/L 17        Tests result DATE comment  HEPC RNA 1.15 million    HEPC  Genotype 1 a    Hepatitis B profile neg    Hepatitis A status Ng    PT/PTT N    AST, ALT, Bilirubin N    Albumin N    creatinine 1.04    HB/Platelet N    HIV NR    AFP N    TSH     comorbidities      tobacco     alcohol     drug     NS5A resistance test     HEPC fibrosure F3    Current meds     Ultrasound Elastography 4.9    Preg Test      Vaccination HEPA/HEPB needs     Counseling done on the following: -Natural progression of hep c, transmission (avoid sharing personal hygiene equipment), prevention, risks of left untreated and treatment options  Partner testing -Avoid hepatotoxins like alcohol and excessive acetamaminphen (no more than 2 gram a day) -Avoid eating raw sea food -Risks of re-infection  -Hepatitis coinfection and vaccination for A/B  ( Pneumococcal vaccination in the cirrhotics )    #If indicated in the setting of cirrhosis: - HCC screening with US  /AFP every 6 months - EGD to r/o varices in cirrhotics  Impression/Recommendation Chronic hepatitis C infection With no decompensation With US  showing normal Kpa of 4.9 Pt did not tolerate Mayvret Will see whether insurance will cover Epclusa which is 1 pill for 12 weeks  ? Anxiety - clonazepam, trazadone  Hypertension- enalapril , nebivolol , amlodipine   BPH -tamsulosin  ______________________________________ Discussed with patient, and niece Follow up after we hear from insurance Will need HEPA and B vaccination

## 2024-12-21 ENCOUNTER — Other Ambulatory Visit: Payer: Self-pay

## 2024-12-22 ENCOUNTER — Other Ambulatory Visit: Payer: Self-pay

## 2024-12-22 ENCOUNTER — Telehealth: Payer: Self-pay

## 2024-12-22 ENCOUNTER — Other Ambulatory Visit (HOSPITAL_COMMUNITY): Payer: Self-pay

## 2024-12-22 DIAGNOSIS — B182 Chronic viral hepatitis C: Secondary | ICD-10-CM

## 2024-12-22 NOTE — Telephone Encounter (Signed)
 Pharmacy Patient Advocate Encounter   Received notification from [employer] that prior authorization for [facility] is required/requested.   Insurance verification completed.   The patient is insured through [facility].   Per test claim: PA required; PA submitted to above mentioned insurance via [employer] Key/confirmation #/EOC [id number] Status is pending

## 2024-12-28 ENCOUNTER — Other Ambulatory Visit: Payer: Self-pay

## 2024-12-28 ENCOUNTER — Telehealth: Payer: Self-pay | Admitting: Pharmacist

## 2024-12-28 ENCOUNTER — Other Ambulatory Visit (HOSPITAL_COMMUNITY): Payer: Self-pay

## 2024-12-28 MED ORDER — SOFOSBUVIR-VELPATASVIR 400-100 MG PO TABS: 1.0000 | ORAL_TABLET | Freq: Every day | ORAL | 2 refills | Status: DC

## 2024-12-28 NOTE — Telephone Encounter (Signed)
 Patient is approved to receive Epclusa  x 12 weeks for chronic Hepatitis C infection. Spoke with Clarita today. Will fill Epclusa  through [facility] mailing. Needs 4-6 week appointment with Dr. Fayette. Thank you!   Counseled patient to take Epclusa  daily with or without food. Encouraged patient not to miss any doses and explained how their chance of cure could go down with each dose missed. Counseled patient on what to do if dose is missed - if it is closer to the missed dose take immediately; if closer to next dose skip dose and take the next dose at the usual time. Counseled patient on common side effects such as headache, fatigue, and nausea and that these normally decrease with time. I reviewed patient medications and found no drug interactions. Discussed with patient that there are several drug interactions including acid suppressants. Instructed patient to call clinic if he wishes to start a new medication during course of therapy. Also advised patient to call if he experiences any side effects.   Alan Geralds, PharmD, CPP, BCIDP, AAHIVP Clinical Pharmacist Practitioner Infectious Diseases Clinical Pharmacist [facility] for Infectious Disease

## 2024-12-28 NOTE — Telephone Encounter (Signed)
 Oh perfect - will do!

## 2024-12-28 NOTE — Progress Notes (Signed)
"   Patient would like to fill at [facility] due to it being closer to them  "

## 2024-12-28 NOTE — Telephone Encounter (Signed)
 Epclusa  PA was approved. Sending rx to [facility] and will counsel patient on appropriate instructions.  Alan Geralds, PharmD, CPP, BCIDP, AAHIVP Clinical Pharmacist Practitioner Infectious Diseases Clinical Pharmacist [facility] for Infectious Disease

## 2024-12-28 NOTE — Telephone Encounter (Signed)
 Maybe easier to talk with the niece the emergency contact

## 2024-12-29 ENCOUNTER — Other Ambulatory Visit (HOSPITAL_COMMUNITY): Payer: Self-pay

## 2024-12-29 ENCOUNTER — Other Ambulatory Visit: Payer: Self-pay | Admitting: Pharmacist

## 2024-12-29 DIAGNOSIS — B182 Chronic viral hepatitis C: Secondary | ICD-10-CM

## 2024-12-29 MED ORDER — SOFOSBUVIR-VELPATASVIR 400-100 MG PO TABS: 1.0000 | ORAL_TABLET | Freq: Every day | ORAL | 2 refills | Status: AC

## 2024-12-29 NOTE — Progress Notes (Signed)
 Patient's niece Jan requests to only fill at [facility] in [place]. Reviewed that they might not be able to order this medication and that we should stick filling with [facility]. She adamantly requests to fill with [facility] first.  Alan Geralds, PharmD, CPP, BCIDP, AAHIVP Clinical Pharmacist Practitioner Infectious Diseases Clinical Pharmacist [facility]

## 2025-01-04 ENCOUNTER — Other Ambulatory Visit (HOSPITAL_COMMUNITY): Payer: Self-pay

## 2025-01-05 ENCOUNTER — Ambulatory Visit: Admitting: Podiatry

## 2025-01-10 ENCOUNTER — Ambulatory Visit: Admitting: Podiatry

## 2025-01-10 DIAGNOSIS — M79674 Pain in right toe(s): Secondary | ICD-10-CM

## 2025-01-10 DIAGNOSIS — B351 Tinea unguium: Secondary | ICD-10-CM | POA: Diagnosis not present

## 2025-01-10 DIAGNOSIS — M79675 Pain in left toe(s): Secondary | ICD-10-CM

## 2025-01-10 NOTE — Progress Notes (Signed)
"  °  Subjective:  Patient ID: Luke Haynes, male    DOB: [birth year],  MRN: [id number]  84 y.o. male presents to clinic with  painful mycotic toenails x 10 which interfere with daily activities. Pain is relieved with periodic professional debridement.  Chief Complaint  Patient presents with   Nail Problem    rfc     New problem(s): None   PCP is Caretha Barter, NP.  Allergies  Allergen Reactions   Sulfa Antibiotics     Review of Systems: Negative except as noted in the HPI.   Objective:  Luke Haynes is a pleasant 84 y.o. male in NAD. AAO x 3.  Vascular Examination: CFT <3 seconds b/l LE. Faintly palpable pedal pulses b/l LE. Pedal hair absent b/l LE. Skin temperature gradient WNL b/l. No pain with calf compression b/l. No edema b/l LE. No cyanosis or clubbing noted b/l LE.  Neurological Examination: Sensation grossly intact b/l with 10 gram monofilament.   Dermatological Examination: Pedal skin with normal turgor, texture and tone b/l. Toenails 1-5 b/l thick, discolored, elongated with subungual debris and pain on dorsal palpation. No corns, calluses nor porokeratotic lesions noted.  Musculoskeletal Examination: Muscle strength 5/5 to b/l LE. No pain, crepitus or joint limitation noted with ROM bilateral LE. No gross bony deformities bilaterally.  Radiographs: None   Assessment:   1. Pain due to onychomycosis of toenails of both feet     Plan:  Patient was evaluated and treated. All patient's and/or POA's questions/concerns addressed on today's visit. Mycotic toenails 1-5 debrided in length and girth without incident. Continue soft, supportive shoe gear daily. Report any pedal injuries to medical professional. Call office if there are any quesitons/concerns. -Patient/POA to call should there be question/concern in the interim.  No follow-ups on file.  Luke Haynes, DPM      [place] LOCATION: [address], [place] [address]                   Office [phone]   [facility] LOCATION: [address] [place], [place] [address] Office [phone] "

## 2025-04-13 ENCOUNTER — Ambulatory Visit: Admitting: Podiatry
